# Patient Record
Sex: Male | Born: 1980 | Race: White | Hispanic: No | Marital: Single | Smoking: Current every day smoker
Health system: Southern US, Community
[De-identification: ages and names within clinical notes are randomized; demographics above are authoritative.]

## PROBLEM LIST (undated history)

## (undated) DIAGNOSIS — J449 Chronic obstructive pulmonary disease, unspecified: Secondary | ICD-10-CM

## (undated) DIAGNOSIS — F17200 Nicotine dependence, unspecified, uncomplicated: Secondary | ICD-10-CM

## (undated) HISTORY — DX: Nicotine dependence, unspecified, uncomplicated: F17.200

## (undated) HISTORY — PX: BRAIN SURGERY: SHX531

## (undated) HISTORY — PX: INGUINAL HERNIA REPAIR: SUR1180

## (undated) HISTORY — DX: Chronic obstructive pulmonary disease, unspecified: J44.9

---
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
## 2003-05-24 ENCOUNTER — Emergency Department (HOSPITAL_COMMUNITY): Admission: EM | Admit: 2003-05-24 | Discharge: 2003-05-24 | Payer: Self-pay | Admitting: Emergency Medicine

## 2004-12-02 ENCOUNTER — Emergency Department (HOSPITAL_COMMUNITY): Admission: EM | Admit: 2004-12-02 | Discharge: 2004-12-03 | Payer: Self-pay | Admitting: Emergency Medicine

## 2004-12-09 ENCOUNTER — Emergency Department (HOSPITAL_COMMUNITY): Admission: EM | Admit: 2004-12-09 | Discharge: 2004-12-10 | Payer: Self-pay | Admitting: Emergency Medicine

## 2004-12-21 ENCOUNTER — Emergency Department (HOSPITAL_COMMUNITY): Admission: EM | Admit: 2004-12-21 | Discharge: 2004-12-21 | Payer: Self-pay | Admitting: Emergency Medicine

## 2005-02-03 ENCOUNTER — Emergency Department (HOSPITAL_COMMUNITY): Admission: EM | Admit: 2005-02-03 | Discharge: 2005-02-03 | Payer: Self-pay | Admitting: Emergency Medicine

## 2005-11-10 ENCOUNTER — Emergency Department (HOSPITAL_COMMUNITY): Admission: EM | Admit: 2005-11-10 | Discharge: 2005-11-10 | Payer: Self-pay | Admitting: Emergency Medicine

## 2006-05-19 ENCOUNTER — Emergency Department (HOSPITAL_COMMUNITY): Admission: EM | Admit: 2006-05-19 | Discharge: 2006-05-19 | Payer: Self-pay | Admitting: Emergency Medicine

## 2008-01-13 ENCOUNTER — Encounter (INDEPENDENT_AMBULATORY_CARE_PROVIDER_SITE_OTHER): Payer: Self-pay | Admitting: Nurse Practitioner

## 2008-01-13 ENCOUNTER — Ambulatory Visit: Payer: Self-pay | Admitting: Family Medicine

## 2008-01-13 LAB — CONVERTED CEMR LAB
ALT: 16 units/L (ref 0–53)
AST: 17 units/L (ref 0–37)
Alkaline Phosphatase: 71 units/L (ref 39–117)
BUN: 16 mg/dL (ref 6–23)
Basophils Absolute: 0.1 10*3/uL (ref 0.0–0.1)
Basophils Relative: 1 % (ref 0–1)
Creatinine, Ser: 0.87 mg/dL (ref 0.40–1.50)
Eosinophils Absolute: 0.2 10*3/uL (ref 0.0–0.7)
HDL: 41 mg/dL (ref >39)
Hemoglobin: 16.5 g/dL (ref 13.0–17.0)
LDL Cholesterol: 102 mg/dL — ABNORMAL HIGH (ref 0–99)
MCHC: 33.5 g/dL (ref 30.0–36.0)
MCV: 90.8 fL (ref 78.0–100.0)
Monocytes Absolute: 0.7 10*3/uL (ref 0.1–1.0)
Monocytes Relative: 8 % (ref 3–12)
Neutro Abs: 5.9 10*3/uL (ref 1.7–7.7)
Neutrophils Relative %: 71 % (ref 43–77)
RBC: 5.43 M/uL (ref 4.22–5.81)
RDW: 14.2 % (ref 11.5–15.5)
TSH: 1.45 microintl units/mL (ref 0.350–5.50)
Total CHOL/HDL Ratio: 4
VLDL: 19 mg/dL (ref 0–40)

## 2009-04-26 ENCOUNTER — Encounter: Payer: Self-pay | Admitting: Emergency Medicine

## 2009-04-26 ENCOUNTER — Inpatient Hospital Stay (HOSPITAL_COMMUNITY): Admission: EM | Admit: 2009-04-26 | Discharge: 2009-04-29 | Payer: Self-pay | Admitting: *Deleted

## 2009-04-27 ENCOUNTER — Ambulatory Visit: Payer: Self-pay | Admitting: Physical Medicine & Rehabilitation

## 2009-09-19 ENCOUNTER — Emergency Department (HOSPITAL_COMMUNITY): Admission: EM | Admit: 2009-09-19 | Discharge: 2009-09-20 | Payer: Self-pay | Admitting: Emergency Medicine

## 2009-09-22 ENCOUNTER — Emergency Department (HOSPITAL_COMMUNITY): Admission: EM | Admit: 2009-09-22 | Discharge: 2009-09-22 | Payer: Self-pay | Admitting: Emergency Medicine

## 2010-03-08 ENCOUNTER — Emergency Department (HOSPITAL_COMMUNITY): Admission: EM | Admit: 2010-03-08 | Discharge: 2010-03-08 | Payer: Self-pay | Admitting: Emergency Medicine

## 2010-05-02 ENCOUNTER — Emergency Department (HOSPITAL_COMMUNITY): Admission: EM | Admit: 2010-05-02 | Discharge: 2010-05-02 | Payer: Self-pay | Admitting: Emergency Medicine

## 2011-01-15 ENCOUNTER — Emergency Department (HOSPITAL_COMMUNITY)
Admission: EM | Admit: 2011-01-15 | Discharge: 2011-01-15 | Disposition: A | Discharge status: Home / Self Care | Payer: Self-pay | Attending: Emergency Medicine | Admitting: Emergency Medicine

## 2011-01-15 DIAGNOSIS — R51 Headache: Secondary | ICD-10-CM | POA: Insufficient documentation

## 2011-01-15 DIAGNOSIS — H9190 Unspecified hearing loss, unspecified ear: Secondary | ICD-10-CM | POA: Insufficient documentation

## 2011-01-15 DIAGNOSIS — H539 Unspecified visual disturbance: Secondary | ICD-10-CM | POA: Insufficient documentation

## 2011-01-15 DIAGNOSIS — H6120 Impacted cerumen, unspecified ear: Secondary | ICD-10-CM | POA: Insufficient documentation

## 2011-01-15 DIAGNOSIS — R42 Dizziness and giddiness: Secondary | ICD-10-CM | POA: Insufficient documentation

## 2011-01-15 DIAGNOSIS — H53149 Visual discomfort, unspecified: Secondary | ICD-10-CM | POA: Insufficient documentation

## 2011-01-15 DIAGNOSIS — J3489 Other specified disorders of nose and nasal sinuses: Secondary | ICD-10-CM | POA: Insufficient documentation

## 2011-03-05 LAB — BASIC METABOLIC PANEL
BUN: 9 mg/dL (ref 6–23)
CO2: 23 mEq/L (ref 19–32)
Calcium: 8.7 mg/dL (ref 8.4–10.5)
Calcium: 8.1 mg/dL — ABNORMAL LOW (ref 8.4–10.5)
Chloride: 107 mEq/L (ref 96–112)
Chloride: 108 mEq/L (ref 96–112)
Creatinine, Ser: 0.89 mg/dL (ref 0.4–1.5)
Creatinine, Ser: 0.96 mg/dL (ref 0.4–1.5)
Creatinine, Ser: 0.89 mg/dL (ref 0.4–1.5)
GFR calc Af Amer: >60 mL/min (ref >60)
GFR calc Af Amer: >60 mL/min (ref >60)
GFR calc non Af Amer: >60 mL/min (ref >60)
GFR calc non Af Amer: >60 mL/min (ref >60)
Sodium: 139 mEq/L (ref 135–145)

## 2011-03-05 LAB — CBC
MCHC: 33.8 g/dL (ref 30.0–36.0)
MCV: 91 fL (ref 78.0–100.0)
MCV: 91.9 fL (ref 78.0–100.0)
Platelets: 129 10*3/uL — ABNORMAL LOW (ref 150–400)
Platelets: 131 10*3/uL — ABNORMAL LOW (ref 150–400)
RBC: 5.01 MIL/uL (ref 4.22–5.81)
RBC: 5.36 MIL/uL (ref 4.22–5.81)
RDW: 13.5 % (ref 11.5–15.5)
RDW: 13.9 % (ref 11.5–15.5)
WBC: 15.6 10*3/uL — ABNORMAL HIGH (ref 4.0–10.5)
WBC: 20.7 10*3/uL — ABNORMAL HIGH (ref 4.0–10.5)

## 2011-03-05 LAB — URINE CULTURE

## 2011-03-05 LAB — DIFFERENTIAL
Basophils Absolute: 0.1 10*3/uL (ref 0.0–0.1)
Basophils Relative: 1 % (ref 0–1)
Monocytes Relative: 6 % (ref 3–12)
Neutro Abs: 15.2 10*3/uL — ABNORMAL HIGH (ref 1.7–7.7)
Neutrophils Relative %: 74 % (ref 43–77)

## 2011-03-05 LAB — POCT I-STAT, CHEM 8
Calcium, Ion: 1.01 mmol/L — ABNORMAL LOW (ref 1.12–1.32)
Glucose, Bld: 110 mg/dL — ABNORMAL HIGH (ref 70–99)
HCT: 53 % — ABNORMAL HIGH (ref 39.0–52.0)
Hemoglobin: 18 g/dL — ABNORMAL HIGH (ref 13.0–17.0)
TCO2: 22 mmol/L (ref 0–100)

## 2011-03-05 LAB — URINALYSIS, ROUTINE W REFLEX MICROSCOPIC
Bilirubin Urine: NEGATIVE
Nitrite: NEGATIVE
Specific Gravity, Urine: 1.011 (ref 1.005–1.030)
Urobilinogen, UA: 0.2 mg/dL (ref 0.0–1.0)

## 2011-03-05 LAB — URINALYSIS, MICROSCOPIC ONLY
Glucose, UA: NEGATIVE mg/dL
Leukocytes, UA: NEGATIVE
pH: 7 (ref 5.0–8.0)

## 2011-03-05 LAB — RAPID URINE DRUG SCREEN, HOSP PERFORMED
Amphetamines: NOT DETECTED
Barbiturates: NOT DETECTED
Benzodiazepines: NOT DETECTED
Tetrahydrocannabinol: NOT DETECTED

## 2011-03-05 LAB — CULTURE, BLOOD (ROUTINE X 2): Culture: NO GROWTH
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
## 2015-09-15 ENCOUNTER — Encounter (HOSPITAL_COMMUNITY): Payer: Self-pay | Admitting: Neurology

## 2015-09-15 ENCOUNTER — Emergency Department (HOSPITAL_COMMUNITY): Payer: Self-pay

## 2015-09-15 ENCOUNTER — Emergency Department (HOSPITAL_COMMUNITY)
Admission: EM | Admit: 2015-09-15 | Discharge: 2015-09-15 | Discharge status: Against Medical Advice | Payer: Self-pay | Attending: Emergency Medicine | Admitting: Emergency Medicine

## 2015-09-15 DIAGNOSIS — R0602 Shortness of breath: Secondary | ICD-10-CM | POA: Insufficient documentation

## 2015-09-15 DIAGNOSIS — Z720 Tobacco use: Secondary | ICD-10-CM | POA: Insufficient documentation

## 2015-09-15 DIAGNOSIS — R079 Chest pain, unspecified: Secondary | ICD-10-CM | POA: Insufficient documentation

## 2015-09-15 LAB — BASIC METABOLIC PANEL
Anion gap: 10 (ref 5–15)
BUN: 8 mg/dL (ref 6–20)
CALCIUM: 8.4 mg/dL — AB (ref 8.9–10.3)
CO2: 22 mmol/L (ref 22–32)
CREATININE: 1.04 mg/dL (ref 0.61–1.24)
Chloride: 104 mmol/L (ref 101–111)
GFR calc non Af Amer: >60 mL/min (ref >60)
Glucose, Bld: 92 mg/dL (ref 65–99)
Potassium: 4.1 mmol/L (ref 3.5–5.1)
SODIUM: 136 mmol/L (ref 135–145)

## 2015-09-15 LAB — I-STAT TROPONIN, ED: Troponin i, poc: 0 ng/mL (ref 0.00–0.08)

## 2015-09-15 LAB — CBC
HCT: 48.7 % (ref 39.0–52.0)
Hemoglobin: 16.9 g/dL (ref 13.0–17.0)
MCH: 31.3 pg (ref 26.0–34.0)
MCHC: 34.7 g/dL (ref 30.0–36.0)
MCV: 90.2 fL (ref 78.0–100.0)
PLATELETS: 144 10*3/uL — AB (ref 150–400)
RBC: 5.4 MIL/uL (ref 4.22–5.81)
RDW: 13.4 % (ref 11.5–15.5)
WBC: 12 10*3/uL — AB (ref 4.0–10.5)

## 2015-09-15 NOTE — ED Notes (Addendum)
CALLED FOR ROOM- X3. NO ANSWER

## 2015-09-15 NOTE — ED Notes (Signed)
Pt reports cp and sob for 1 weelk, this morning got worse. Has central cp no radiation, deneis n/v/d. Denies cardiac hx is a x 4

## 2016-12-01 ENCOUNTER — Encounter (HOSPITAL_COMMUNITY): Payer: Self-pay

## 2016-12-01 ENCOUNTER — Emergency Department (HOSPITAL_COMMUNITY)
Admission: EM | Admit: 2016-12-01 | Discharge: 2016-12-01 | Disposition: A | Discharge status: Home / Self Care | Payer: Self-pay | Attending: Emergency Medicine | Admitting: Emergency Medicine

## 2016-12-01 DIAGNOSIS — Y9389 Activity, other specified: Secondary | ICD-10-CM | POA: Insufficient documentation

## 2016-12-01 DIAGNOSIS — T148XXA Other injury of unspecified body region, initial encounter: Secondary | ICD-10-CM

## 2016-12-01 DIAGNOSIS — X500XXA Overexertion from strenuous movement or load, initial encounter: Secondary | ICD-10-CM | POA: Insufficient documentation

## 2016-12-01 DIAGNOSIS — Y929 Unspecified place or not applicable: Secondary | ICD-10-CM | POA: Insufficient documentation

## 2016-12-01 DIAGNOSIS — S29012A Strain of muscle and tendon of back wall of thorax, initial encounter: Secondary | ICD-10-CM | POA: Insufficient documentation

## 2016-12-01 DIAGNOSIS — Y990 Civilian activity done for income or pay: Secondary | ICD-10-CM | POA: Insufficient documentation

## 2016-12-01 DIAGNOSIS — F17200 Nicotine dependence, unspecified, uncomplicated: Secondary | ICD-10-CM | POA: Insufficient documentation

## 2016-12-01 MED ORDER — IBUPROFEN 800 MG PO TABS: 800.0000 mg | ORAL_TABLET | Freq: Three times a day (TID) | ORAL | 0 refills | Status: DC

## 2016-12-01 MED ORDER — METHOCARBAMOL 500 MG PO TABS
500.0000 mg | ORAL_TABLET | Freq: Two times a day (BID) | ORAL | 0 refills | Status: DC
Start: 2016-12-01 — End: 2018-09-02

## 2016-12-01 NOTE — Discharge Instructions (Signed)
Expect your soreness to increase over the next 2-3 days. Take it easy, but do not lay around too much as this may make any stiffness worse. Take 500 mg of naproxen every 12 hours or 800 mg of ibuprofen every 8 hours for the next 3 days. Take these medications with food to avoid upset stomach. Robaxin is a muscle relaxer and may help loosen stiff muscles. Do not take the Robaxin while driving or performing other dangerous activities. Be sure to perform the attached exercises starting with three times a week and working up to performing them daily. This is an essential part of preventing long term problems. Follow up with a primary care provider for any future management of these complaints. You may also try lidocaine patches, such as Salonpas, applied directly to the painful area.

## 2016-12-01 NOTE — ED Notes (Signed)
ED Provider at bedside. 

## 2016-12-01 NOTE — ED Triage Notes (Signed)
Pt presents with upper back pain and L arm pain while picking up boxes at work last night.  Pt also reports 1 week h/o productive cough with green phlegm and nasal congestion.

## 2016-12-01 NOTE — ED Provider Notes (Signed)
[facility]-EMERGENCY DEPT Provider Note   CSN: [id number] Arrival date & time: 12/01/16  1333  By signing my name below, I, Sonum Patel, attest that this documentation has been prepared under the direction and in the presence of Jeany Seville, PA-C . Electronically Signed: Sonum Patel, Neurosurgeon. 12/01/16. 3:33 PM.  History   Chief Complaint Chief Complaint  Patient presents with  . Back Pain    The history is provided by the patient. No language interpreter was used.     HPI Comments: Douglas Guzman is a 36 y.o. male who presents to the Emergency Department complaining of constant left sided upper to mid back pain that began last night. He reports lifting unusually heavy boxes at work last night. He states the pain is worse with movement of the left arm and upper body. He denies numbness, weakness, chest pain, shortness of breath, or any other complaints.   History reviewed. No pertinent past medical history.  There are no active problems to display for this patient.   Past Surgical History:  Procedure Laterality Date  . BRAIN SURGERY         Home Medications    Prior to Admission medications   Medication Sig Start Date End Date Taking? Authorizing Provider  ibuprofen (ADVIL,MOTRIN) 800 MG tablet Take 1 tablet (800 mg total) by mouth 3 (three) times daily. 12/01/16   Obediah Welles C Charliene Inoue, PA-C  methocarbamol (ROBAXIN) 500 MG tablet Take 1 tablet (500 mg total) by mouth 2 (two) times daily. 12/01/16   Anselm Pancoast, PA-C    Family History History reviewed. No pertinent family history.  Social History Social History  Substance Use Topics  . Smoking status: Current Every Day Smoker  . Smokeless tobacco: Never Used  . Alcohol use Yes     Allergies   Patient has no known allergies.   Review of Systems Review of Systems  Musculoskeletal: Positive for back pain and myalgias.  Neurological: Negative for weakness and numbness.     Physical Exam Updated Vital Signs BP 112/68 (BP  Location: Right Arm)   Pulse 74   Temp 98.3 F (36.8 C) (Oral)   Resp 18   Ht 5\' 8"  (1.727 m)   Wt 68 kg   SpO2 98%   BMI 22.81 kg/m   Physical Exam  Constitutional: He appears well-developed and well-nourished. No distress.  HENT:  Head: Normocephalic and atraumatic.  Eyes: Conjunctivae are normal.  Neck: Neck supple.  Cardiovascular: Normal rate, regular rhythm and intact distal pulses.   Pulmonary/Chest: Effort normal and breath sounds normal. No respiratory distress.  Musculoskeletal: He exhibits tenderness.  Tenderness over the left trapezius, left scapula, and left latissimus dorsi. Full range of motion in the left shoulder and neck. Normal motor function intact in all other extremities and spine. No midline spinal tenderness.   Neurological: He is alert.  No sensory deficits. Strength 5 out of 5 in bilateral upper extremities.  Skin: Skin is warm and dry. Capillary refill takes less than 2 seconds. He is not diaphoretic.  Psychiatric: He has a normal mood and affect. His behavior is normal.  Nursing note and vitals reviewed.    ED Treatments / Results  DIAGNOSTIC STUDIES: Oxygen Saturation is 98% on RA, normal by my interpretation.    COORDINATION OF CARE: 3:33 PM Discussed treatment plan with pt at bedside and pt agreed to plan.   Labs (all labs ordered are listed, but only abnormal results are displayed) Labs Reviewed - No data to  display  EKG  EKG Interpretation None       Radiology No results found.  Procedures Procedures (including critical care time)  Medications Ordered in ED Medications - No data to display   Initial Impression / Assessment and Plan / ED Course  I have reviewed the triage vital signs and the nursing notes.  Pertinent labs & imaging results that were available during my care of the patient were reviewed by me and considered in my medical decision making (see chart for details).  Clinical Course     Patient presents with  muscular tenderness and a story consistent with muscle strain. No neuro or functional deficits. The patient was given instructions for home care as well as return precautions. Patient voices understanding of these instructions, accepts the plan, and is comfortable with discharge.    Final Clinical Impressions(s) / ED Diagnoses   Final diagnoses:  Muscle strain    New Prescriptions Discharge Medication List as of 12/01/2016  3:36 PM    START taking these medications   Details  ibuprofen (ADVIL,MOTRIN) 800 MG tablet Take 1 tablet (800 mg total) by mouth 3 (three) times daily., Starting Sat 12/01/2016, Print    methocarbamol (ROBAXIN) 500 MG tablet Take 1 tablet (500 mg total) by mouth 2 (two) times daily., Starting Sat 12/01/2016, Print       I personally performed the services described in this documentation, which was scribed in my presence. The recorded information has been reviewed and is accurate.   Anselm PancoastShawn C Azaryah Oleksy, PA-C 12/01/16 1809    Nira ConnPedro Eduardo Cardama, MD 12/02/16 [phone]

## 2017-05-19 ENCOUNTER — Emergency Department (HOSPITAL_COMMUNITY): Payer: Self-pay

## 2017-05-19 ENCOUNTER — Emergency Department (HOSPITAL_COMMUNITY)
Admission: EM | Admit: 2017-05-19 | Discharge: 2017-05-19 | Disposition: A | Discharge status: Home / Self Care | Payer: Self-pay | Attending: Emergency Medicine | Admitting: Emergency Medicine

## 2017-05-19 DIAGNOSIS — W25XXXA Contact with sharp glass, initial encounter: Secondary | ICD-10-CM | POA: Insufficient documentation

## 2017-05-19 DIAGNOSIS — S61011A Laceration without foreign body of right thumb without damage to nail, initial encounter: Secondary | ICD-10-CM | POA: Insufficient documentation

## 2017-05-19 DIAGNOSIS — Y9389 Activity, other specified: Secondary | ICD-10-CM | POA: Insufficient documentation

## 2017-05-19 DIAGNOSIS — S51811A Laceration without foreign body of right forearm, initial encounter: Secondary | ICD-10-CM | POA: Insufficient documentation

## 2017-05-19 DIAGNOSIS — Z23 Encounter for immunization: Secondary | ICD-10-CM | POA: Insufficient documentation

## 2017-05-19 DIAGNOSIS — Y92009 Unspecified place in unspecified non-institutional (private) residence as the place of occurrence of the external cause: Secondary | ICD-10-CM | POA: Insufficient documentation

## 2017-05-19 DIAGNOSIS — S41111A Laceration without foreign body of right upper arm, initial encounter: Secondary | ICD-10-CM

## 2017-05-19 DIAGNOSIS — Y998 Other external cause status: Secondary | ICD-10-CM | POA: Insufficient documentation

## 2017-05-19 DIAGNOSIS — F17200 Nicotine dependence, unspecified, uncomplicated: Secondary | ICD-10-CM | POA: Insufficient documentation

## 2017-05-19 MED ORDER — LIDOCAINE-EPINEPHRINE (PF) 2 %-1:200000 IJ SOLN
10.0000 mL | Freq: Once | INTRAMUSCULAR | Status: AC
  Administered 2017-05-19: 10 mL
  Filled 2017-05-19: qty 20

## 2017-05-19 MED ORDER — BACITRACIN ZINC 500 UNIT/GM EX OINT: 1.0000 "application " | TOPICAL_OINTMENT | Freq: Three times a day (TID) | CUTANEOUS | 1 refills | Status: DC

## 2017-05-19 MED ORDER — LIDOCAINE HCL (PF) 1 % IJ SOLN
5.0000 mL | Freq: Once | INTRAMUSCULAR | Status: AC
  Administered 2017-05-19: 5 mL
  Filled 2017-05-19: qty 5

## 2017-05-19 MED ORDER — TETANUS-DIPHTH-ACELL PERTUSSIS 5-2.5-18.5 LF-MCG/0.5 IM SUSP
0.5000 mL | Freq: Once | INTRAMUSCULAR | Status: AC
  Administered 2017-05-19: 0.5 mL via INTRAMUSCULAR
  Filled 2017-05-19: qty 0.5

## 2017-05-19 NOTE — Discharge Instructions (Signed)
Please follow up in 7-10 days for suture removal.
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
## 2017-05-19 NOTE — ED Notes (Signed)
Patient transported to X-ray 

## 2017-05-29 ENCOUNTER — Emergency Department (HOSPITAL_COMMUNITY)
Admission: EM | Admit: 2017-05-29 | Discharge: 2017-05-29 | Disposition: A | Discharge status: Home / Self Care | Payer: Self-pay | Attending: Emergency Medicine | Admitting: Emergency Medicine

## 2017-05-29 ENCOUNTER — Encounter (HOSPITAL_COMMUNITY): Payer: Self-pay | Admitting: Emergency Medicine

## 2017-05-29 DIAGNOSIS — Y999 Unspecified external cause status: Secondary | ICD-10-CM | POA: Insufficient documentation

## 2017-05-29 DIAGNOSIS — S4981XD Other specified injuries of right shoulder and upper arm, subsequent encounter: Secondary | ICD-10-CM | POA: Insufficient documentation

## 2017-05-29 DIAGNOSIS — W25XXXD Contact with sharp glass, subsequent encounter: Secondary | ICD-10-CM | POA: Insufficient documentation

## 2017-05-29 DIAGNOSIS — Y9389 Activity, other specified: Secondary | ICD-10-CM | POA: Insufficient documentation

## 2017-05-29 DIAGNOSIS — Z4802 Encounter for removal of sutures: Secondary | ICD-10-CM

## 2017-05-29 DIAGNOSIS — F17210 Nicotine dependence, cigarettes, uncomplicated: Secondary | ICD-10-CM | POA: Insufficient documentation

## 2017-05-29 DIAGNOSIS — Y929 Unspecified place or not applicable: Secondary | ICD-10-CM | POA: Insufficient documentation

## 2017-05-29 DIAGNOSIS — Z79899 Other long term (current) drug therapy: Secondary | ICD-10-CM | POA: Insufficient documentation

## 2017-05-29 NOTE — ED Triage Notes (Signed)
Pt here for suture removal to right arm and hand
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
## 2017-05-29 NOTE — Discharge Instructions (Signed)
Apply bacitracin to the wounds. Keep clean, wash with soap and water. Follow up with family doctor as needed.

## 2017-10-23 ENCOUNTER — Ambulatory Visit: Payer: Self-pay | Admitting: Family Medicine

## 2018-01-18 ENCOUNTER — Emergency Department (HOSPITAL_COMMUNITY): Payer: Self-pay

## 2018-01-18 ENCOUNTER — Emergency Department (HOSPITAL_COMMUNITY)
Admission: EM | Admit: 2018-01-18 | Discharge: 2018-01-18 | Disposition: A | Discharge status: Home / Self Care | Payer: Self-pay | Attending: Emergency Medicine | Admitting: Emergency Medicine

## 2018-01-18 ENCOUNTER — Other Ambulatory Visit: Payer: Self-pay

## 2018-01-18 ENCOUNTER — Encounter (HOSPITAL_COMMUNITY): Payer: Self-pay | Admitting: Emergency Medicine

## 2018-01-18 DIAGNOSIS — R51 Headache: Secondary | ICD-10-CM | POA: Insufficient documentation

## 2018-01-18 DIAGNOSIS — F17200 Nicotine dependence, unspecified, uncomplicated: Secondary | ICD-10-CM | POA: Insufficient documentation

## 2018-01-18 DIAGNOSIS — R519 Headache, unspecified: Secondary | ICD-10-CM

## 2018-01-18 MED ORDER — TETRACAINE HCL 0.5 % OP SOLN
1.0000 [drp] | Freq: Once | OPHTHALMIC | Status: DC
  Filled 2018-01-18: qty 4

## 2018-01-18 MED ORDER — PROCHLORPERAZINE EDISYLATE 5 MG/ML IJ SOLN
10.0000 mg | Freq: Once | INTRAMUSCULAR | Status: AC
  Administered 2018-01-18: 10 mg via INTRAVENOUS
  Filled 2018-01-18: qty 2

## 2018-01-18 MED ORDER — KETOROLAC TROMETHAMINE 30 MG/ML IJ SOLN
30.0000 mg | Freq: Once | INTRAMUSCULAR | Status: AC
  Administered 2018-01-18: 30 mg via INTRAVENOUS
  Filled 2018-01-18: qty 1

## 2018-01-18 NOTE — ED Notes (Signed)
Pt left AMA without notifying staff of intent to leave. Pt called at home and cell to return to ED to have IV removed by staff, no answer from pt.

## 2018-01-18 NOTE — ED Notes (Signed)
ED Provider at bedside. 

## 2018-01-18 NOTE — ED Triage Notes (Signed)
Pt also c/o blurred vision

## 2018-01-18 NOTE — ED Provider Notes (Signed)
[name] Provider Note   CSN: [id number] Arrival date & time: 01/18/18  1358     History   Chief Complaint Chief Complaint  Patient presents with  . Headache    HPI Douglas Guzman is a 37 y.o. male.  HPI  The patient is a 37 year old male, he states he is otherwise healthy, takes no daily medications, states that he has had intermittent headaches over time though they are usually mild and self-limited.  Over the last week he has had more frequent headaches, they seem to be around the right side of his eye, the right side of the face and the right temporal area.  There are throbbing, sometimes they are severe.  The patient denies of vomiting or nausea but has had some photophobia.  He denies any weakness of his arms or his legs, denies any changes in speech but has had some intermittent blurred vision in the right eye only.  The patient has no pain with extraocular movements.  He denies any fevers chills coughing or shortness of breath but has had a mild runny nose.  He denies taking any medications other than some over-the-counter medications with have not helped.  History reviewed. No pertinent past medical history.  There are no active problems to display for this patient.   Past Surgical History:  Procedure Laterality Date  . BRAIN SURGERY         Home Medications    Prior to Admission medications   Medication Sig Start Date End Date Taking? Authorizing Provider  bacitracin ointment Apply 1 application topically 3 (three) times daily. 05/19/17   Everlene Farrieransie, William, PA-C  ibuprofen (ADVIL,MOTRIN) 800 MG tablet Take 1 tablet (800 mg total) by mouth 3 (three) times daily. 12/01/16   Joy, Shawn C, PA-C  methocarbamol (ROBAXIN) 500 MG tablet Take 1 tablet (500 mg total) by mouth 2 (two) times daily. 12/01/16   Anselm PancoastJoy, Shawn C, PA-C    Family History History reviewed. No pertinent family history.  Social History Social History   Tobacco  Use  . Smoking status: Current Every Day Smoker    Packs/day: 2.00  . Smokeless tobacco: Never Used  Substance Use Topics  . Alcohol use: Yes  . Drug use: Not on file     Allergies   Patient has no known allergies.   Review of Systems Review of Systems  All other systems reviewed and are negative.    Physical Exam Updated Vital Signs BP (!) 150/74 (BP Location: Right Arm)   Pulse 61   Temp 98.1 F (36.7 C) (Oral)   Resp 20   Ht 5\' 8"  (1.727 m)   Wt 65.8 kg (145 lb)   SpO2 100%   BMI 22.05 kg/m   Physical Exam  Constitutional: He appears well-developed and well-nourished. No distress.  HENT:  Head: Normocephalic and atraumatic.  Mouth/Throat: Oropharynx is clear and moist. No oropharyngeal exudate.  Normal-appearing face, symmetrical, no swelling, tenderness to palpation in the right periorbital area, the right cheek, the right mandibular area and the right temple.  If this is not focal over the temporal artery.  Eyes: Conjunctivae and EOM are normal. Pupils are equal, round, and reactive to light. Right eye exhibits no discharge. Left eye exhibits no discharge. No scleral icterus.  Disconjugate gaze is present, patient states this is baseline and has had surgery in the past, pupils are equal round and reactive to light  Neck: Normal range of motion. Neck supple. No  JVD present. No thyromegaly present.  Cardiovascular: Normal rate, regular rhythm, normal heart sounds and intact distal pulses. Exam reveals no gallop and no friction rub.  No murmur heard. Pulmonary/Chest: Effort normal and breath sounds normal. No respiratory distress. He has no wheezes. He has no rales.  Abdominal: Soft. Bowel sounds are normal. He exhibits no distension and no mass. There is no tenderness.  Musculoskeletal: Normal range of motion. He exhibits no edema or tenderness.  Lymphadenopathy:    He has no cervical adenopathy.  Neurological: He is alert. Coordination normal.  Speech is clear,  cranial nerves III through XII are intact, memory is intact, strength is normal in all 4 extremities including grips, sensation is intact to light touch and pinprick in all 4 extremities. Coordination as tested by finger-nose-finger is normal, no limb ataxia. Normal gait, normal reflexes at the patellar tendons bilaterally  Skin: Skin is warm and dry. No rash noted. No erythema.  Psychiatric: He has a normal mood and affect. His behavior is normal.  Nursing note and vitals reviewed.    ED Treatments / Results  Labs (all labs ordered are listed, but only abnormal results are displayed) Labs Reviewed - No data to display   Radiology No results found.  Procedures Procedures (including critical care time)  Medications Ordered in ED Medications  tetracaine (PONTOCAINE) 0.5 % ophthalmic solution 1 drop (1 drop Both Eyes Not Given 01/18/18 1845)  ketorolac (TORADOL) 30 MG/ML injection 30 mg (30 mg Intravenous Given 01/18/18 1645)  prochlorperazine (COMPAZINE) injection 10 mg (10 mg Intravenous Given 01/18/18 1645)     Initial Impression / Assessment and Plan / ED Course  I have reviewed the triage vital signs and the nursing notes.  Pertinent labs & imaging results that were available during my care of the patient were reviewed by me and considered in my medical decision making (see chart for details).     Of particular concern would be several different etiologies including optic neuritis, acute glaucoma, less likely to be intracranial.  We will initially obtain CT scan due to severity of pain, doubt subarachnoid hemorrhage given the waxing and waning of symptoms and normal neurologic exam.  The patient left the emergency department against advice, he was not present in the room when CT scan came to get him for his head CT.  He walked out prior to having his eyes evaluated with Tono-Pen.  There was no discussion with the patient as he left prior to any AGAINST MEDICAL ADVICE  discussion.  Final Clinical Impressions(s) / ED Diagnoses   Final diagnoses:  Nonintractable headache, unspecified chronicity pattern, unspecified headache type      Eber Hong, MD 01/18/18 1859

## 2018-01-18 NOTE — ED Triage Notes (Signed)
Patient presents to ED for assessment of right sided facial/head pain starting this morning.  PAtient denies dental pain, denies injury.  States he has been having some neck pain for a while now.  No neuro deficits noted at triage.  Denies light/sound sensitivity.  Denies n/v.

## 2018-06-05 ENCOUNTER — Other Ambulatory Visit: Payer: Self-pay

## 2018-06-05 ENCOUNTER — Encounter (HOSPITAL_COMMUNITY): Payer: Self-pay

## 2018-06-05 ENCOUNTER — Emergency Department (HOSPITAL_COMMUNITY)
Admission: EM | Admit: 2018-06-05 | Discharge: 2018-06-05 | Disposition: A | Discharge status: Home / Self Care | Payer: Self-pay | Attending: Emergency Medicine | Admitting: Emergency Medicine

## 2018-06-05 ENCOUNTER — Emergency Department (HOSPITAL_COMMUNITY): Payer: Self-pay

## 2018-06-05 DIAGNOSIS — R0789 Other chest pain: Secondary | ICD-10-CM | POA: Insufficient documentation

## 2018-06-05 DIAGNOSIS — F17200 Nicotine dependence, unspecified, uncomplicated: Secondary | ICD-10-CM | POA: Insufficient documentation

## 2018-06-05 LAB — HEPATIC FUNCTION PANEL
ALT: 20 U/L (ref 0–44)
AST: 29 U/L (ref 15–41)
Albumin: 4.1 g/dL (ref 3.5–5.0)
Alkaline Phosphatase: 75 U/L (ref 38–126)
BILIRUBIN DIRECT: 0.2 mg/dL (ref 0.0–0.2)
BILIRUBIN INDIRECT: 0.6 mg/dL (ref 0.3–0.9)
TOTAL PROTEIN: 6.6 g/dL (ref 6.5–8.1)
Total Bilirubin: 0.8 mg/dL (ref 0.3–1.2)

## 2018-06-05 LAB — CBC
HEMATOCRIT: 49.5 % (ref 39.0–52.0)
HEMOGLOBIN: 16.5 g/dL (ref 13.0–17.0)
MCH: 31.4 pg (ref 26.0–34.0)
MCHC: 33.3 g/dL (ref 30.0–36.0)
MCV: 94.1 fL (ref 78.0–100.0)
Platelets: 157 10*3/uL (ref 150–400)
RBC: 5.26 MIL/uL (ref 4.22–5.81)
RDW: 14.1 % (ref 11.5–15.5)
WBC: 10.1 10*3/uL (ref 4.0–10.5)

## 2018-06-05 LAB — BASIC METABOLIC PANEL
ANION GAP: 9 (ref 5–15)
BUN: 14 mg/dL (ref 6–20)
CHLORIDE: 103 mmol/L (ref 98–111)
CO2: 27 mmol/L (ref 22–32)
Calcium: 8.9 mg/dL (ref 8.9–10.3)
Creatinine, Ser: 1.12 mg/dL (ref 0.61–1.24)
GFR calc Af Amer: >60 mL/min (ref >60)
GFR calc non Af Amer: >60 mL/min (ref >60)
GLUCOSE: 70 mg/dL (ref 70–99)
POTASSIUM: 4.3 mmol/L (ref 3.5–5.1)
Sodium: 139 mmol/L (ref 135–145)

## 2018-06-05 LAB — URINALYSIS, ROUTINE W REFLEX MICROSCOPIC
Bilirubin Urine: NEGATIVE
Glucose, UA: NEGATIVE mg/dL
Hgb urine dipstick: NEGATIVE
KETONES UR: NEGATIVE mg/dL
Leukocytes, UA: NEGATIVE
NITRITE: NEGATIVE
Protein, ur: NEGATIVE mg/dL
Specific Gravity, Urine: 1.025 (ref 1.005–1.030)
pH: 5 (ref 5.0–8.0)

## 2018-06-05 LAB — I-STAT TROPONIN, ED: Troponin i, poc: 0 ng/mL (ref 0.00–0.08)

## 2018-06-05 LAB — LIPASE, BLOOD: LIPASE: 43 U/L (ref 11–51)

## 2018-06-05 MED ORDER — FAMOTIDINE 20 MG PO TABS: 20.0000 mg | ORAL_TABLET | Freq: Two times a day (BID) | ORAL | 0 refills | Status: DC

## 2018-06-05 MED ORDER — ONDANSETRON HCL 4 MG/2ML IJ SOLN
4.0000 mg | Freq: Once | INTRAMUSCULAR | Status: AC
Start: 2018-06-05 — End: 2018-06-05
  Administered 2018-06-05: 4 mg via INTRAVENOUS
  Filled 2018-06-05: qty 2

## 2018-06-05 MED ORDER — SODIUM CHLORIDE 0.9 % IV BOLUS
500.0000 mL | Freq: Once | INTRAVENOUS | Status: AC
  Administered 2018-06-05: 500 mL via INTRAVENOUS

## 2018-06-05 MED ORDER — KETOROLAC TROMETHAMINE 30 MG/ML IJ SOLN
30.0000 mg | Freq: Once | INTRAMUSCULAR | Status: AC
  Administered 2018-06-05: 30 mg via INTRAVENOUS
  Filled 2018-06-05: qty 1

## 2018-06-05 MED ORDER — NAPROXEN 500 MG PO TABS: 500.0000 mg | ORAL_TABLET | Freq: Two times a day (BID) | ORAL | 0 refills | Status: DC

## 2018-06-05 NOTE — ED Notes (Signed)
Patient Alert and oriented to baseline. Stable and ambulatory to baseline. Patient verbalized understanding of the discharge instructions.  Patient belongings were taken by the patient.   

## 2018-06-05 NOTE — ED Notes (Signed)
Pt not in xray.

## 2018-06-05 NOTE — ED Triage Notes (Signed)
Pt presents for evaluation of central chest pain with radiation to back. Denies cardiac hx.

## 2018-06-05 NOTE — ED Provider Notes (Signed)
[name] EMERGENCY DEPARTMENT Provider Note   CSN: [id number] Arrival date & time: 06/05/18  1338     History   Chief Complaint Chief Complaint  Patient presents with  . Chest Pain    HPI Douglas Guzman is a 37 y.o. male who presents to ED for evaluation of 4-hour history of lower chest/epigastric/periumbilical pain that began after waking up this morning.  The pain radiates to his back.  He reports associated shortness of breath, cough productive with mucus, dysuria, nausea.  He has not taken any medicine to help with his symptoms.  He reports having chronic shortness of breath.  No sick contacts with similar symptoms.  Denies any fever, vomiting, diarrhea, constipation, hemoptysis, leg swelling, recent surgeries, recent prolonged travel, history of DVT, PE or MI.  He reports daily tobacco and occasional alcohol use.  Denies any other drug use.  HPI  History reviewed. No pertinent past medical history.  There are no active problems to display for this patient.   Past Surgical History:  Procedure Laterality Date  . BRAIN SURGERY          Home Medications    Prior to Admission medications   Medication Sig Start Date End Date Taking? Authorizing Provider  bacitracin ointment Apply 1 application topically 3 (three) times daily. 05/19/17   Everlene Farrier, PA-C  famotidine (PEPCID) 20 MG tablet Take 1 tablet (20 mg total) by mouth 2 (two) times daily. 06/05/18   Loria Lacina, PA-C  ibuprofen (ADVIL,MOTRIN) 800 MG tablet Take 1 tablet (800 mg total) by mouth 3 (three) times daily. 12/01/16   Joy, Shawn C, PA-C  methocarbamol (ROBAXIN) 500 MG tablet Take 1 tablet (500 mg total) by mouth 2 (two) times daily. 12/01/16   Joy, Shawn C, PA-C  naproxen (NAPROSYN) 500 MG tablet Take 1 tablet (500 mg total) by mouth 2 (two) times daily. 06/05/18   Dietrich Pates, PA-C    Family History No family history on file.  Social History Social History   Tobacco Use  .  Smoking status: Current Every Day Smoker    Packs/day: 2.00  . Smokeless tobacco: Never Used  Substance Use Topics  . Alcohol use: Yes  . Drug use: Not on file     Allergies   Patient has no known allergies.   Review of Systems Review of Systems  Constitutional: Negative for appetite change, chills and fever.  HENT: Negative for congestion, ear pain, rhinorrhea, sneezing and sore throat.   Eyes: Negative for photophobia and visual disturbance.  Respiratory: Positive for cough and shortness of breath. Negative for chest tightness and wheezing.   Cardiovascular: Positive for chest pain. Negative for palpitations.  Gastrointestinal: Positive for abdominal pain and nausea. Negative for blood in stool, constipation, diarrhea and vomiting.  Genitourinary: Positive for dysuria. Negative for hematuria and urgency.  Musculoskeletal: Negative for myalgias.  Skin: Negative for rash.  Neurological: Negative for dizziness, weakness and light-headedness.     Physical Exam Updated Vital Signs BP 120/81 (BP Location: Right Arm)   Pulse (!) 50   Temp 97.8 F (36.6 C) (Oral)   Resp 16   SpO2 100%   Physical Exam  Constitutional: He appears well-developed and well-nourished. No distress.  HENT:  Head: Normocephalic and atraumatic.  Nose: Nose normal.  Eyes: Conjunctivae and EOM are normal. Right eye exhibits no discharge. Left eye exhibits no discharge. No scleral icterus.  Neck: Normal range of motion. Neck supple.  Cardiovascular: Normal rate, regular rhythm,  normal heart sounds and intact distal pulses. Exam reveals no gallop and no friction rub.  No murmur heard. Pulmonary/Chest: Effort normal and breath sounds normal. No respiratory distress.  No chest tenderness to palpation.  Abdominal: Soft. Bowel sounds are normal. He exhibits no distension. There is no tenderness. There is no guarding.    Musculoskeletal: Normal range of motion. He exhibits no edema.  No lower extremity  edema, erythema or calf tenderness bilaterally.  Neurological: He is alert. He exhibits normal muscle tone. Coordination normal.  Skin: Skin is warm and dry. No rash noted.  Psychiatric: He has a normal mood and affect.  Nursing note and vitals reviewed.    ED Treatments / Results  Labs (all labs ordered are listed, but only abnormal results are displayed) Labs Reviewed  BASIC METABOLIC PANEL  CBC  HEPATIC FUNCTION PANEL  LIPASE, BLOOD  URINALYSIS, ROUTINE W REFLEX MICROSCOPIC  I-STAT TROPONIN, ED    EKG EKG Interpretation  Date/Time:  Thursday June 05 2018 15:26:15 EDT Ventricular Rate:  50 PR Interval:  168 QRS Duration: 100 QT Interval:  474 QTC Calculation: 432 R Axis:   119 Text Interpretation:  Sinus bradycardia Biatrial enlargement Right axis deviation Incomplete right bundle branch block Anterior infarct , age undetermined Abnormal ECG Since last tracing rate slower Confirmed by Jacalyn LefevreHaviland, Julie [phone]([id number]) on 06/05/2018 3:47:56 PM   Radiology Dg Chest 2 View  Result Date: 06/05/2018 CLINICAL DATA:  37 year old male with a history of chest pain EXAM: CHEST - 2 VIEW COMPARISON:  09/15/2015 FINDINGS: Cardiomediastinal silhouette within normal limits. No evidence of central vascular congestion. No pneumothorax or pleural effusion. No confluent airspace disease. No displaced fracture. IMPRESSION: Negative for acute cardiopulmonary disease Electronically Signed   By: Gilmer MorJaime  Wagner D.O.   On: 06/05/2018 14:29    Procedures Procedures (including critical care time)  Medications Ordered in ED Medications  sodium chloride 0.9 % bolus 500 mL (500 mLs Intravenous New Bag/Given 06/05/18 1734)  ondansetron (ZOFRAN) injection 4 mg (4 mg Intravenous Given 06/05/18 1624)  ketorolac (TORADOL) 30 MG/ML injection 30 mg (30 mg Intravenous Given 06/05/18 1734)     Initial Impression / Assessment and Plan / ED Course  I have reviewed the triage vital signs and the nursing  notes.  Pertinent labs & imaging results that were available during my care of the patient were reviewed by me and considered in my medical decision making (see chart for details).     37 year old male presents to ED for evaluation of 4-hour history of lower chest/epigastric/periumbilical pain that began after waking up this morning.  Pain radiates to his back.  Reports associated shortness of breath, cough productive with mucus, dysuria and nausea.  Has not taken any medicine help with pain.  Denies any fever, vomiting, bowel changes, hemoptysis, leg swelling, recent surgeries, recent prolonged travel, history of DVT, PE or MI.  On physical exam he is overall well-appearing.  He no lower extremity edema, erythema or calf tenderness bilaterally.  Lungs are clear to auscultation bilaterally.  He is not tachycardic, tachypneic or hypoxic.  There is generalized abdominal tenderness to palpation with no specific right lower quadrant tenderness, rebound or guarding noted.  He appears overall well.  He is afebrile.  Patient's labs here including CBC, CMP, lipase, troponin, urinalysis all unremarkable.  EKG is nonischemic.  Chest x-ray is unremarkable.  Patient was given fluids and anti-inflammatories with complete resolution of his symptoms.  He is low risk by heart score and PERC negative.  I doubt cardiac or pulmonary cause of symptoms.  Suspect that symptoms are musculoskeletal in nature.  Will discharge home with anti-inflammatories, antacids and PCP follow-up.  Advised to return to ED for any severe worsening symptoms.  Portions of this note were generated with Scientist, clinical (histocompatibility and immunogenetics). Dictation errors may occur despite best attempts at proofreading.   Final Clinical Impressions(s) / ED Diagnoses   Final diagnoses:  Chest wall pain    ED Discharge Orders        Ordered    naproxen (NAPROSYN) 500 MG tablet  2 times daily     06/05/18 1752    famotidine (PEPCID) 20 MG tablet  2 times daily      06/05/18 1752       Dietrich Pates, PA-C 06/05/18 1755    Jacalyn Lefevre, MD 06/05/18 1950

## 2018-06-05 NOTE — Discharge Instructions (Signed)
Return to ED for any worsening symptoms including trouble breathing or trouble swallowing, severe chest pain, lightheadedness or loss of consciousness.

## 2018-06-05 NOTE — ED Notes (Signed)
Unable to locate pt  

## 2018-08-06 ENCOUNTER — Ambulatory Visit: Payer: Self-pay | Attending: Family Medicine | Admitting: Family Medicine

## 2018-08-06 ENCOUNTER — Encounter: Payer: Self-pay | Admitting: Family Medicine

## 2018-08-06 ENCOUNTER — Ambulatory Visit (HOSPITAL_COMMUNITY)
Admission: RE | Admit: 2018-08-06 | Discharge: 2018-08-06 | Disposition: A | Discharge status: Home / Self Care | Payer: Medicaid Other | Source: Ambulatory Visit | Attending: Family Medicine | Admitting: Family Medicine

## 2018-08-06 ENCOUNTER — Telehealth: Payer: Self-pay

## 2018-08-06 ENCOUNTER — Other Ambulatory Visit: Payer: Self-pay

## 2018-08-06 VITALS — BP 129/80 | HR 67 | Temp 98.4°F | Resp 20 | Ht 69.0 in | Wt 140.8 lb

## 2018-08-06 DIAGNOSIS — R058 Other specified cough: Secondary | ICD-10-CM

## 2018-08-06 DIAGNOSIS — R05 Cough: Secondary | ICD-10-CM

## 2018-08-06 DIAGNOSIS — G8929 Other chronic pain: Secondary | ICD-10-CM | POA: Insufficient documentation

## 2018-08-06 DIAGNOSIS — Z833 Family history of diabetes mellitus: Secondary | ICD-10-CM | POA: Insufficient documentation

## 2018-08-06 DIAGNOSIS — Z8249 Family history of ischemic heart disease and other diseases of the circulatory system: Secondary | ICD-10-CM | POA: Insufficient documentation

## 2018-08-06 DIAGNOSIS — K1370 Unspecified lesions of oral mucosa: Secondary | ICD-10-CM

## 2018-08-06 DIAGNOSIS — F17200 Nicotine dependence, unspecified, uncomplicated: Secondary | ICD-10-CM

## 2018-08-06 DIAGNOSIS — Z808 Family history of malignant neoplasm of other organs or systems: Secondary | ICD-10-CM | POA: Insufficient documentation

## 2018-08-06 DIAGNOSIS — M545 Low back pain, unspecified: Secondary | ICD-10-CM

## 2018-08-06 DIAGNOSIS — Z23 Encounter for immunization: Secondary | ICD-10-CM

## 2018-08-06 DIAGNOSIS — K219 Gastro-esophageal reflux disease without esophagitis: Secondary | ICD-10-CM

## 2018-08-06 DIAGNOSIS — R350 Frequency of micturition: Secondary | ICD-10-CM

## 2018-08-06 DIAGNOSIS — F17210 Nicotine dependence, cigarettes, uncomplicated: Secondary | ICD-10-CM | POA: Insufficient documentation

## 2018-08-06 DIAGNOSIS — Z825 Family history of asthma and other chronic lower respiratory diseases: Secondary | ICD-10-CM | POA: Insufficient documentation

## 2018-08-06 DIAGNOSIS — R0789 Other chest pain: Secondary | ICD-10-CM | POA: Insufficient documentation

## 2018-08-06 DIAGNOSIS — R0602 Shortness of breath: Secondary | ICD-10-CM

## 2018-08-06 DIAGNOSIS — R55 Syncope and collapse: Secondary | ICD-10-CM

## 2018-08-06 LAB — POCT URINALYSIS DIP (CLINITEK)
Bilirubin, UA: NEGATIVE
Blood, UA: NEGATIVE
Glucose, UA: NEGATIVE mg/dL
Ketones, POC UA: NEGATIVE mg/dL
Leukocytes, UA: NEGATIVE
Nitrite, UA: NEGATIVE
POC,PROTEIN,UA: NEGATIVE
Spec Grav, UA: 1.01
Urobilinogen, UA: 0.2 U/dL
pH, UA: 7

## 2018-08-06 MED ORDER — DOXYCYCLINE HYCLATE 100 MG PO TABS: 100.0000 mg | ORAL_TABLET | Freq: Two times a day (BID) | ORAL | 0 refills | Status: DC

## 2018-08-06 MED ORDER — OMEPRAZOLE 40 MG PO CPDR: 40.0000 mg | DELAYED_RELEASE_CAPSULE | Freq: Every day | ORAL | 6 refills | Status: DC

## 2018-08-06 MED FILL — OMEPRAZOLE DR 40 MG CAPSULE: 40 | 30 days supply | Qty: 30 | Fill #0

## 2018-08-06 MED FILL — DOXYCYCLINE HYCLATE 100 MG: 100 | 10 days supply | Qty: 20 | Fill #0

## 2018-08-06 NOTE — Telephone Encounter (Signed)
Patient was called, a family member answered and stated that he was not currently in. Asked if they could ask the patient to give a call back at earliest convenience. If patient returns call Please notify patient of normal urinalysis

## 2018-08-06 NOTE — Progress Notes (Signed)
Flu shot yes  Pain back , sob, chest pain comes and goes  Patient fell, getting out bed, hit head 3days ago

## 2018-08-06 NOTE — Telephone Encounter (Signed)
-----   Message from [name] [name], MD sent at 08/06/2018  3:14 PM EDT ----- Please notify patient of normal urinalysis 
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
## 2018-08-06 NOTE — Telephone Encounter (Signed)
-----   Message from Cain Saupe, MD sent at 08/06/2018  3:14 PM EDT ----- Please notify patient of normal urinalysis

## 2018-08-07 LAB — CBC WITH DIFFERENTIAL/PLATELET
Basophils Absolute: 0.1 x10E3/uL (ref 0.0–0.2)
Basos: 1 %
EOS (ABSOLUTE): 0.2 x10E3/uL (ref 0.0–0.4)
Eos: 1 %
Hematocrit: 50.6 % (ref 37.5–51.0)
Hemoglobin: 17.8 g/dL — ABNORMAL HIGH (ref 13.0–17.7)
Immature Grans (Abs): 0.1 x10E3/uL (ref 0.0–0.1)
Immature Granulocytes: 1 %
Lymphocytes Absolute: 2 x10E3/uL (ref 0.7–3.1)
Lymphs: 17 %
MCH: 31.4 pg (ref 26.6–33.0)
MCHC: 35.2 g/dL (ref 31.5–35.7)
MCV: 89 fL (ref 79–97)
Monocytes Absolute: 0.8 x10E3/uL (ref 0.1–0.9)
Monocytes: 7 %
Neutrophils Absolute: 8.6 x10E3/uL — ABNORMAL HIGH (ref 1.4–7.0)
Neutrophils: 73 %
Platelets: 176 x10E3/uL (ref 150–450)
RBC: 5.66 x10E6/uL (ref 4.14–5.80)
RDW: 13 % (ref 12.3–15.4)
WBC: 11.8 x10E3/uL — ABNORMAL HIGH (ref 3.4–10.8)

## 2018-08-07 LAB — COMPREHENSIVE METABOLIC PANEL WITH GFR
ALT: 30 IU/L (ref 0–44)
AST: 26 IU/L (ref 0–40)
Albumin/Globulin Ratio: 2.3 — ABNORMAL HIGH (ref 1.2–2.2)
Albumin: 4.3 g/dL (ref 3.5–5.5)
Alkaline Phosphatase: 84 IU/L (ref 39–117)
BUN/Creatinine Ratio: 9 (ref 9–20)
BUN: 10 mg/dL (ref 6–20)
Bilirubin Total: 0.4 mg/dL (ref 0.0–1.2)
CO2: 24 mmol/L (ref 20–29)
Calcium: 8.9 mg/dL (ref 8.7–10.2)
Chloride: 101 mmol/L (ref 96–106)
Creatinine, Ser: 1.06 mg/dL (ref 0.76–1.27)
GFR calc Af Amer: 102 mL/min/1.73
GFR calc non Af Amer: 89 mL/min/1.73
Globulin, Total: 1.9 g/dL (ref 1.5–4.5)
Glucose: 95 mg/dL (ref 65–99)
Potassium: 5.2 mmol/L (ref 3.5–5.2)
Sodium: 137 mmol/L (ref 134–144)
Total Protein: 6.2 g/dL (ref 6.0–8.5)

## 2018-08-22 ENCOUNTER — Institutional Professional Consult (permissible substitution): Payer: Medicaid Other | Admitting: Pulmonary Disease

## 2018-08-22 NOTE — Progress Notes (Deleted)
Synopsis: Referred in September 2019 for shortness of breath by Cain Saupe, MD  Subjective:   PATIENT ID: Douglas Guzman GENDER: male DOB: 1980/12/13, MRN: [id number]  No chief complaint on file.   HPI  ***  No past medical history on file.   No family history on file.   Past Surgical History:  Procedure Laterality Date  . BRAIN SURGERY      Social History   Socioeconomic History  . Marital status: Single    Spouse name: Not on file  . Number of children: Not on file  . Years of education: Not on file  . Highest education level: Not on file  Occupational History  . Not on file  Social Needs  . Financial resource strain: Not on file  . Food insecurity:    Worry: Not on file    Inability: Not on file  . Transportation needs:    Medical: Not on file    Non-medical: Not on file  Tobacco Use  . Smoking status: Current Every Day Smoker    Packs/day: 2.00    Types: Cigarettes  . Smokeless tobacco: Never Used  Substance and Sexual Activity  . Alcohol use: Yes  . Drug use: Never  . Sexual activity: Yes  Lifestyle  . Physical activity:    Days per week: Not on file    Minutes per session: Not on file  . Stress: Not on file  Relationships  . Social connections:    Talks on phone: Not on file    Gets together: Not on file    Attends religious service: Not on file    Active member of club or organization: Not on file    Attends meetings of clubs or organizations: Not on file    Relationship status: Not on file  . Intimate partner violence:    Fear of current or ex partner: Not on file    Emotionally abused: Not on file    Physically abused: Not on file    Forced sexual activity: Not on file  Other Topics Concern  . Not on file  Social History Narrative  . Not on file     No Known Allergies   Outpatient Medications Prior to Visit  Medication Sig Dispense Refill  . bacitracin ointment Apply 1 application topically 3 (three) times daily. (Patient not  taking: Reported on 08/06/2018) 28 g 1  . doxycycline (VIBRA-TABS) 100 MG tablet Take 1 tablet (100 mg total) by mouth 2 (two) times daily. 20 tablet 0  . famotidine (PEPCID) 20 MG tablet Take 1 tablet (20 mg total) by mouth 2 (two) times daily. (Patient not taking: Reported on 08/06/2018) 30 tablet 0  . ibuprofen (ADVIL,MOTRIN) 800 MG tablet Take 1 tablet (800 mg total) by mouth 3 (three) times daily. (Patient not taking: Reported on 08/06/2018) 21 tablet 0  . methocarbamol (ROBAXIN) 500 MG tablet Take 1 tablet (500 mg total) by mouth 2 (two) times daily. (Patient not taking: Reported on 08/06/2018) 20 tablet 0  . naproxen (NAPROSYN) 500 MG tablet Take 1 tablet (500 mg total) by mouth 2 (two) times daily. (Patient not taking: Reported on 08/06/2018) 30 tablet 0  . omeprazole (PRILOSEC) 40 MG capsule Take 1 capsule (40 mg total) by mouth daily. 30 capsule 6   No facility-administered medications prior to visit.     ROS   Objective:  Physical Exam   There were no vitals filed for this visit.   on *** LPM ***  RA BMI Readings from Last 3 Encounters:  08/06/18 20.79 kg/m  01/18/18 22.05 kg/m  05/29/17 22.05 kg/m   Wt Readings from Last 3 Encounters:  08/06/18 140 lb 12.8 oz (63.9 kg)  01/18/18 145 lb (65.8 kg)  05/29/17 145 lb (65.8 kg)     CBC    Component Value Date/Time   WBC 11.8 (H) 08/06/2018 1159   WBC 10.1 06/05/2018 1349   RBC 5.66 08/06/2018 1159   RBC 5.26 06/05/2018 1349   HGB 17.8 (H) 08/06/2018 1159   HCT 50.6 08/06/2018 1159   PLT 176 08/06/2018 1159   MCV 89 08/06/2018 1159   MCH 31.4 08/06/2018 1159   MCH 31.4 06/05/2018 1349   MCHC 35.2 08/06/2018 1159   MCHC 33.3 06/05/2018 1349   RDW 13.0 08/06/2018 1159   LYMPHSABS 2.0 08/06/2018 1159   MONOABS 1.2 (H) 04/26/2009 0359   EOSABS 0.2 08/06/2018 1159   BASOSABS 0.1 08/06/2018 1159    ***  Chest Imaging: Chest x-ray 08/06/2018: No active pulmonary process The patient's images have been independently  reviewed by me.    Pulmonary Functions Testing Results: None   FeNO: None   Pathology: None   Echocardiogram: None   Heart Catheterization: None     Assessment & Plan:   SOB (shortness of breath)  Discussion: ***   Current Outpatient Medications:  .  bacitracin ointment, Apply 1 application topically 3 (three) times daily. (Patient not taking: Reported on 08/06/2018), Disp: 28 g, Rfl: 1 .  doxycycline (VIBRA-TABS) 100 MG tablet, Take 1 tablet (100 mg total) by mouth 2 (two) times daily., Disp: 20 tablet, Rfl: 0 .  famotidine (PEPCID) 20 MG tablet, Take 1 tablet (20 mg total) by mouth 2 (two) times daily. (Patient not taking: Reported on 08/06/2018), Disp: 30 tablet, Rfl: 0 .  ibuprofen (ADVIL,MOTRIN) 800 MG tablet, Take 1 tablet (800 mg total) by mouth 3 (three) times daily. (Patient not taking: Reported on 08/06/2018), Disp: 21 tablet, Rfl: 0 .  methocarbamol (ROBAXIN) 500 MG tablet, Take 1 tablet (500 mg total) by mouth 2 (two) times daily. (Patient not taking: Reported on 08/06/2018), Disp: 20 tablet, Rfl: 0 .  naproxen (NAPROSYN) 500 MG tablet, Take 1 tablet (500 mg total) by mouth 2 (two) times daily. (Patient not taking: Reported on 08/06/2018), Disp: 30 tablet, Rfl: 0 .  omeprazole (PRILOSEC) 40 MG capsule, Take 1 capsule (40 mg total) by mouth daily., Disp: 30 capsule, Rfl: 6   Josephine Igo, DO [facility] 08/22/2018 8:47 AM

## 2018-08-25 ENCOUNTER — Telehealth: Payer: Self-pay | Admitting: *Deleted

## 2018-08-25 ENCOUNTER — Ambulatory Visit: Payer: Medicaid Other | Admitting: Family Medicine

## 2018-08-25 NOTE — Telephone Encounter (Signed)
Patient verified DOB Patient is aware of chest xray being normal and states he still has a few antibiotics left. MA advised patient to complete the script. No further questions.

## 2018-08-25 NOTE — Telephone Encounter (Signed)
-----   Message from Cain Saupe, MD sent at 08/07/2018 12:13 PM EDT ----- Notify patient of normal Chest Xray but he should take the prescribed antibiotics

## 2018-08-26 ENCOUNTER — Ambulatory Visit: Payer: Medicaid Other | Admitting: Family Medicine

## 2018-09-02 ENCOUNTER — Encounter: Payer: Self-pay | Admitting: Internal Medicine

## 2018-09-02 ENCOUNTER — Ambulatory Visit (INDEPENDENT_AMBULATORY_CARE_PROVIDER_SITE_OTHER): Payer: Medicaid Other | Admitting: Internal Medicine

## 2018-09-02 VITALS — BP 120/66 | HR 60 | Ht 68.0 in | Wt 138.0 lb

## 2018-09-02 DIAGNOSIS — J449 Chronic obstructive pulmonary disease, unspecified: Secondary | ICD-10-CM

## 2018-09-02 DIAGNOSIS — K219 Gastro-esophageal reflux disease without esophagitis: Secondary | ICD-10-CM

## 2018-09-02 DIAGNOSIS — F17210 Nicotine dependence, cigarettes, uncomplicated: Secondary | ICD-10-CM | POA: Insufficient documentation

## 2018-09-02 DIAGNOSIS — R0609 Other forms of dyspnea: Secondary | ICD-10-CM | POA: Insufficient documentation

## 2018-09-02 MED ORDER — BUDESONIDE-FORMOTEROL FUMARATE 160-4.5 MCG/ACT IN AERO: 2.0000 | INHALATION_SPRAY | Freq: Two times a day (BID) | RESPIRATORY_TRACT | 11 refills | Status: DC

## 2018-09-02 MED ORDER — BUDESONIDE-FORMOTEROL FUMARATE 160-4.5 MCG/ACT IN AERO: 2.0000 | INHALATION_SPRAY | Freq: Two times a day (BID) | RESPIRATORY_TRACT | 0 refills | Status: DC

## 2018-09-02 MED ORDER — OMEPRAZOLE 40 MG PO CPDR: DELAYED_RELEASE_CAPSULE | ORAL | Status: DC

## 2018-09-02 NOTE — Assessment & Plan Note (Addendum)
09/02/2018   Walked RA x one lap @ 185 stopped due to  Sob/ no desat   Symptoms are markedly disproportionate to objective findings and not clear to what extent this is actually a pulmonary  problem but pt does appear to have difficult to sort out respiratory symptoms of unknown origin for which  DDX  = almost all start with A and  include Adherence, Ace Inhibitors, Acid Reflux, Active Sinus Disease, Alpha 1 Antitripsin deficiency, Anxiety masquerading as Airways dz,  ABPA,  Allergy(esp in young), Aspiration (esp in elderly), Adverse effects of meds,  Active smokers, A bunch of PE's/clot burden (a few small clots can't cause this syndrome unless there is already severe underlying pulm or vascular dz with poor reserve),  Anemia or thyroid disorder, plus two Bs  = Bronchiectasis and Beta blocker use..and one C= CHF      Adherence is always the initial "prime suspect" and is a multilayered concern that requires a "trust but verify" approach in every patient - starting with knowing how to use medications, especially inhalers, correctly, keeping up with refills and understanding the fundamental difference between maintenance and prns vs those medications only taken for a very short course and then stopped and not refilled.  - see hfa teaching - with all meds in hand using a trust but verify approach to confirm accurate Medication  Reconciliation The principal here is that until we are certain that the  patients are doing what we've asked, it makes no sense to ask them to do more.   Active smoking also at top of list (see separate a/p)   ? Acid (or non-acid) GERD > always difficult to exclude as up to 75% of pts in some series report no assoc GI/ Heartburn symptoms> rec continue max (24h)  acid suppression and stop naprosyn as cp also likely due to gerd and not mscp  ? Allergy/ asthma > high dose symbiocrt pending full pfts in f/u   ? Alpha one AT def > send screen  ? Anxiety/depression deconditioning  >  usually at the bottom of this list of usual suspects but should be   higher on this pt's based on H and P and note   and may interfere with adherence and also interpretation of response or lack thereof to symptom management which can be quite subjective.     ? Anemia/ thyroid dz >   ? CHF > send echo

## 2018-09-02 NOTE — Assessment & Plan Note (Signed)
Spirometry 09/02/2018  FEV1 2.5 (62%)  Ratio 67 s prior rx - 09/02/2018  After extensive coaching inhaler device,  effectiveness = 75%     >> try symbicort 160 2bid   - alpha one AT screen  09/02/2018     Total time devoted to counseling  > 50 % of initial 60 min office visit:  review case with pt/ discussion of options/alternatives/ personally creating written customized instructions  in presence of pt  then going over those specific  Instructions directly with the pt including how to use all of the meds but in particular covering each new medication in detail and the difference between the maintenance= "automatic" meds and the prns using an action plan format for the latter (If this problem/symptom => do that organization reading Left to right).  Please see AVS from this visit for a full list of these instructions which I personally wrote for this pt and  are unique to this visit.  See device teaching which extended face to face time for this visit

## 2018-09-02 NOTE — Progress Notes (Signed)
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 Pulmonary Consult    Referred by Dr Cain Saupe.  Pt c/o SOB over the past 2 yrs, progressively getting worse. He states he gets SOB when he lies flat and has to sleep propped up. He states that he gets winded walking to his mailbox.   Dyspnea:  Indolent onset progressive to point of doe x 50 ft and also orthopnea x one month prior to OV assoc with midline chest tightness not necessarily related to exertion daily for about a month> dx mscp rx aleve/ pepcid no better and then rx ppi bid ac improved  Cough: white esp in am x sev tbsp mucoid thick  Sleep: 30 degrees helps a lot  X sev months SABA use: none, says never used inhaler   No obvious day to day or daytime variability or assoc excess/ purulent sputum or mucus plugs or hemoptysis or cp or chest tightness, subjective wheeze or overt sinus symptoms.   Also denies any obvious fluctuation of symptoms with weather or environmental changes or other aggravating or alleviating factors except as outlined above   No unusual exposure hx or h/o childhood pna/ asthma or knowledge of premature birth.  Current Allergies, Complete Past Medical History, Past Surgical History, Family History, and Social History were reviewed in [employer] record.  ROS  The following are not active complaints unless bolded Hoarseness, sore throat, dysphagia, dental problems, itching, sneezing,  nasal congestion or discharge of excess mucus or purulent secretions, ear ache,   fever, chills, sweats, unintended wt loss or wt gain, classically pleuritic or exertional cp,  orthopnea pnd or arm/hand  swelling  or leg swelling, presyncope, palpitations, abdominal pain, anorexia, nausea, vomiting, diarrhea  or change in bowel habits or change in bladder habits, change in stools or change in urine, dysuria, hematuria,  rash, arthralgias, visual complaints, headache, numbness, weakness or ataxia or problems with walking or coordination,  change in mood= depression or  memory.           No past medical history on file.  Outpatient Medications Prior to Visit  Medication Sig Dispense Refill  . doxycycline (VIBRA-TABS) 100 MG tablet Take 1 tablet (100 mg total) by mouth 2 (two) times daily. 20 tablet 0  . omeprazole (PRILOSEC) 40 MG capsule Take 1 capsule (40 mg total) by mouth daily. 30 capsule 6  . bacitracin ointment Apply 1 application topically 3 (three) times daily. (Patient not taking: Reported on 08/06/2018) 28 g 1  . famotidine (PEPCID) 20 MG tablet Take 1 tablet (20 mg total) by mouth 2 (two) times daily. (Patient not taking: Reported on 08/06/2018) 30 tablet 0  . ibuprofen (ADVIL,MOTRIN) 800 MG tablet Take 1 tablet (800 mg total) by mouth 3 (three) times daily. (Patient not taking: Reported on 08/06/2018) 21 tablet 0  . methocarbamol (ROBAXIN) 500 MG tablet Take 1 tablet (500 mg total) by mouth 2 (two) times daily. (Patient not taking: Reported on 08/06/2018) 20 tablet 0  . naproxen (NAPROSYN) 500 MG tablet Take 1 tablet (500 mg total) by mouth 2 (two) times daily. (Patient not  taking: Reported on 08/06/2018) 30 tablet 0            Objective:     BP 120/66 (BP Location: Left Arm, Cuff Size: Normal)   Pulse 60   Ht 5\' 8"  (1.727 m)   Wt 138 lb (62.6 kg)   SpO2 97%   BMI 20.98 kg/m   SpO2: 97 %  RA   Thin amb wm >> stated age     HEENT:  Edentulous with nl  oropharynx. Nl external ear canals without cough reflex -  Mild bilateral non-specific turbinate edema     NECK :  without JVD/Nodes/TM/ nl carotid upstrokes bilaterally   LUNGS: no acc muscle use,  Mild barrel  contour  chest wall with bilateral  Distant bs s audible wheeze and  without cough on insp or exp maneuver and mild  Hyperresonant  to  percussion bilaterally     CV:  RRR  no s3 or murmur or increase in P2, and no edema   ABD:  soft and nontender with pos late  insp Hoover's  in the supine position. No bruits or organomegaly appreciated, bowel sounds nl  MS:   Nl gait/  ext warm without deformities, calf tenderness, cyanosis or clubbing No obvious joint restrictions   SKIN: warm and dry without lesions    NEURO:  alert, approp, nl sensorium with  no motor or cerebellar deficits apparent.      Labs ordered 09/02/2018  Alpha one testing    I personally reviewed images and agree with radiology impression as follows:  CXR:   08/06/18  No active cardiopulmonary disease.    Assessment   DOE (dyspnea on exertion) 09/02/2018   Walked RA x one lap @ 185 stopped due to  Sob/ no desat   Symptoms are markedly disproportionate to objective findings and not clear to what extent this is actually a pulmonary  problem but pt does appear to have difficult to sort out respiratory symptoms of unknown origin for which  DDX  = almost all start with A and  include Adherence, Ace Inhibitors, Acid Reflux, Active Sinus Disease, Alpha 1 Antitripsin deficiency, Anxiety masquerading as Airways dz,  ABPA,  Allergy(esp in young), Aspiration (esp in elderly), Adverse effects of meds,  Active smokers, A bunch of PE's/clot burden (a few small clots can't cause this syndrome unless there is already severe underlying pulm or vascular dz with poor reserve),  Anemia or thyroid disorder, plus two Bs  = Bronchiectasis and Beta blocker use..and one C= CHF      Adherence is always the initial "prime suspect" and is a multilayered concern that requires a "trust but verify" approach in every patient - starting with knowing how to use medications, especially inhalers, correctly, keeping up with refills and understanding the fundamental  difference between maintenance and prns vs those medications only taken for a very short course and then stopped and not refilled.  - see hfa teaching - with all meds in hand using a trust but verify approach to confirm accurate Medication  Reconciliation The principal here is that until we are certain that the  patients are doing what we've asked, it makes no sense to ask them to do more.   Active smoking also at top of list (see separate a/p)   ? Acid (or non-acid) GERD > always difficult to exclude as up to 75% of pts in some series report no assoc GI/ Heartburn symptoms> rec continue max (24h)  acid suppression  and stop naprosyn as cp also likely due to gerd and not mscp  ? Allergy/ asthma > high dose symbiocrt pending full pfts in f/u   ? Alpha one AT def > send screen  ? Anxiety/depression deconditioning  > usually at the bottom of this list of usual suspects but should be   higher on this pt's based on H and P and note   and may interfere with adherence and also interpretation of response or lack thereof to symptom management which can be quite subjective.     ? Anemia/ thyroid dz >   ? CHF > send echo     Cigarette smoker 4-5 min discussion re active cigarette smoking in addition to office E&M  Ask about tobacco use:   Active  Advise quitting     I reviewed the Fletcher curve with the patient that basically indicates  if you quit smoking when your best day FEV1 is still well preserved (as is clearly  the case here)  it is highly unlikely you will progress to severe disease and informed the patient there was  no medication on the market that has proven to alter the curve/ its downward trajectory  or the likelihood of progression of their disease(unlike other chronic medical conditions such as atheroclerosis where we do think we can change the natural hx with risk reducing meds)    Therefore stopping smoking and maintaining abstinence are  the most important aspects of care, not  choice of inhalers or for that matter, doctors.  Treatment other than smoking cessation  is entirely directed by severity of symptoms and focused also on reducing exacerbations, not attempting to change the natural history of the disease.   (inhalers are like high octane fuel to a car engine developing too much wear on it's valves, it won't make the engine run longer, just perform some better)  Assess willingness:  Not committed at this point Assist in quit attempt:  Per PCP when ready Arrange follow up:   Follow up per Primary Care planned        COPD GOLD II/ still smoking  Spirometry 09/02/2018  FEV1 2.5 (62%)  Ratio 67 s prior rx - 09/02/2018  After extensive coaching inhaler device,  effectiveness = 75%     >> try symbicort 160 2bid   - alpha one AT screen  09/02/2018     Total time devoted to counseling  > 50 % of initial 60 min office visit:  review case with pt/ discussion of options/alternatives/ personally creating written customized instructions  in presence of pt  then going over those specific  Instructions directly with the pt including how to use all of the meds but in particular covering each new medication in detail and the difference between the maintenance= "automatic" meds and the prns using an action plan format for the latter (If this problem/symptom => do that organization reading Left to right).  Please see AVS from this visit for a full list of these instructions which I personally wrote for this pt and  are unique to this visit.  See device teaching which extended face to face time for this visit      Sandrea Hughs, MD 09/02/2018

## 2018-09-02 NOTE — Patient Instructions (Addendum)
Stop naprosyn but continue the prilosec (omeprazole)  Take 30- 60 min before your first and last meals of the day    Symbicort 160 Take 2 puffs first thing in am and then another 2 puffs about 12 hours later.      Work on inhaler technique:  relax and gently blow all the way out then take a nice smooth deep breath back in, triggering the inhaler at same time you start breathing in.  Hold for up to 5 seconds if you can. Blow out thru nose. Rinse and gargle with water when done      Please schedule a follow up office visit in 6 weeks, call sooner if needed with all medications /inhalers/ solutions in hand so we can verify exactly what you are taking. This includes all medications from all doctors and over the counters  - PFT's on return

## 2018-09-02 NOTE — Assessment & Plan Note (Signed)
4-5 min discussion re active cigarette smoking in addition to office E&M  Ask about tobacco use:   Active  Advise quitting     I reviewed the Fletcher curve with the patient that basically indicates  if you quit smoking when your best day FEV1 is still well preserved (as is clearly  the case here)  it is highly unlikely you will progress to severe disease and informed the patient there was  no medication on the market that has proven to alter the curve/ its downward trajectory  or the likelihood of progression of their disease(unlike other chronic medical conditions such as atheroclerosis where we do think we can change the natural hx with risk reducing meds)    Therefore stopping smoking and maintaining abstinence are  the most important aspects of care, not choice of inhalers or for that matter, doctors.  Treatment other than smoking cessation  is entirely directed by severity of symptoms and focused also on reducing exacerbations, not attempting to change the natural history of the disease.   (inhalers are like high octane fuel to a car engine developing too much wear on it's valves, it won't make the engine run longer, just perform some better)  Assess willingness:  Not committed at this point Assist in quit attempt:  Per PCP when ready Arrange follow up:   Follow up per Primary Care planned

## 2018-09-03 ENCOUNTER — Encounter: Payer: Self-pay | Admitting: Internal Medicine

## 2018-09-25 MED FILL — OMEPRAZOLE DR 40 MG CAPSULE: 40 | 30 days supply | Qty: 30 | Fill #1

## 2018-10-09 ENCOUNTER — Encounter: Payer: Self-pay | Admitting: Family Medicine

## 2018-10-09 ENCOUNTER — Ambulatory Visit: Payer: Self-pay | Attending: Family Medicine | Admitting: Family Medicine

## 2018-10-09 VITALS — BP 140/78 | HR 73 | Temp 98.5°F | Ht 69.0 in | Wt 137.4 lb

## 2018-10-09 DIAGNOSIS — R55 Syncope and collapse: Secondary | ICD-10-CM | POA: Insufficient documentation

## 2018-10-09 DIAGNOSIS — Z8249 Family history of ischemic heart disease and other diseases of the circulatory system: Secondary | ICD-10-CM | POA: Insufficient documentation

## 2018-10-09 DIAGNOSIS — J449 Chronic obstructive pulmonary disease, unspecified: Secondary | ICD-10-CM | POA: Insufficient documentation

## 2018-10-09 DIAGNOSIS — M4184 Other forms of scoliosis, thoracic region: Secondary | ICD-10-CM | POA: Insufficient documentation

## 2018-10-09 DIAGNOSIS — F17210 Nicotine dependence, cigarettes, uncomplicated: Secondary | ICD-10-CM | POA: Insufficient documentation

## 2018-10-09 DIAGNOSIS — M546 Pain in thoracic spine: Secondary | ICD-10-CM

## 2018-10-09 DIAGNOSIS — M545 Low back pain, unspecified: Secondary | ICD-10-CM

## 2018-10-09 DIAGNOSIS — M419 Scoliosis, unspecified: Secondary | ICD-10-CM

## 2018-10-09 DIAGNOSIS — M5440 Lumbago with sciatica, unspecified side: Secondary | ICD-10-CM

## 2018-10-09 DIAGNOSIS — G8929 Other chronic pain: Secondary | ICD-10-CM

## 2018-10-09 MED ORDER — IBUPROFEN 600 MG PO TABS: 600.0000 mg | ORAL_TABLET | Freq: Three times a day (TID) | ORAL | 3 refills | Status: DC | PRN

## 2018-10-09 MED ORDER — METHOCARBAMOL 500 MG PO TABS: 500.0000 mg | ORAL_TABLET | Freq: Every evening | ORAL | 3 refills | Status: DC | PRN

## 2018-10-09 NOTE — Progress Notes (Signed)
Patient is here for 2 week follow-up. Pt. Stated he have an upcoming appt. To follow-up with his pulmonologist.   Pt. Stated he still have shortness of breath and the inhaler that his Pulmonologist prescribed for him helps him.

## 2018-10-09 NOTE — Patient Instructions (Signed)

## 2018-10-09 NOTE — Progress Notes (Signed)
Subjective:    Patient ID: Douglas Guzman, male    DOB: 14-Jul-1981, 37 y.o.   MRN: [id number]  HPI 37 yo male seen in follow-up of recurrent syncopal episodes, mid and low back pain as well as COPD.  Patient states that since his last visit here he has seen pulmonology and was placed on Symbicort which has been helping.  Patient has had no further syncopal episodes.  Patient continues to have pain in his mid and low back.  Patient states that the pain in his mid back is about a 7 on a 0-to-10 scale but can sometimes be greater.  Patient states that the pain is worse if he is reaching up/leaning forward.  Pain is a constant dull ache but can also be sharp.  Patient states that his mid back pain is chronic.  Patient also with pain in the right lower back that comes and goes.  Pain sometimes has burning/sharp quality that radiates into the right buttock/right upper leg.  This pain is not present at today's visit.    Patient started a job this week and states that the job has caused him to have an increase in his back pain.      Patient reports that his COPD/breathing is better after starting Symbicort which was prescribed by pulmonology.  Patient however does report that he continues to smoke.  Patient denies any current productive cough and no fever chills.  Patient has had no further syncopal episodes. Past Medical History:  Diagnosis Date  . COPD (chronic obstructive pulmonary disease) (HCC)   . Tobacco dependence    Past Surgical History:  Procedure Laterality Date  . BRAIN SURGERY    . INGUINAL HERNIA REPAIR     occurred in childhood   Family History  Problem Relation Age of Onset  . Diabetes Mother   . COPD Mother   . CAD Father   . Throat cancer Father    Social History   Tobacco Use  . Smoking status: Current Every Day Smoker    Packs/day: 1.50    Years: 26.00    Pack years: 39.00    Types: Cigarettes  . Smokeless tobacco: Never Used  Substance Use Topics  . Alcohol use: Yes   . Drug use: Never  No Known Allergies   Review of Systems  Constitutional: Positive for fatigue. Negative for chills and fever.  Respiratory: Positive for shortness of breath (Improved with use of Symbicort). Negative for cough.   Cardiovascular: Negative for chest pain, palpitations and leg swelling.  Gastrointestinal: Negative for abdominal pain and nausea.  Genitourinary: Negative for dysuria and frequency.  Musculoskeletal: Positive for back pain and myalgias. Negative for arthralgias, gait problem and joint swelling.  Neurological: Negative for dizziness and headaches.       Objective:   Physical Exam BP 140/78 (BP Location: Right Arm, Patient Position: Sitting, Cuff Size: Normal)   Pulse 73   Temp 98.5 F (36.9 C) (Oral)   Ht 5\' 9"  (1.753 m)   Wt 137 lb 6.4 oz (62.3 kg)   SpO2 98%   BMI 20.29 kg/m Nurse's notes and vital signs reviewed General- thin framed male who appears older than his stated age, who is in no acute distress Lungs-clear to auscultation bilaterally Cardiovascular-regular rate and rhythm Back-no CVA tenderness.  Patient has thoracic scoliosis with thoracic and lumbar paraspinous spasm.  Patient with mild lumbosacral and right SI joint discomfort to palpation.  Patient with tenderness in the mid thoracic spine  area       Assessment & Plan:  1. Chronic midline thoracic back pain Patient was scoliosis in the thoracic spine area which is the most likely cause of his pain.  Patient will be referred to physical therapy for further evaluation and treatment.  Patient provided with prescription for ibuprofen 600 mg to take up to every 6 hours as needed for pain.  Patient is aware that he needs to take the medication after eating to avoid stomach upset.  Prescription also provided for Robaxin to take at bedtime as needed for muscle spasm.  Patient is aware the medication may cause drowsiness. - Ambulatory referral to Physical Therapy - ibuprofen (ADVIL,MOTRIN) 600  MG tablet; Take 1 tablet (600 mg total) by mouth every 8 (eight) hours as needed. Take after eating  Dispense: 60 tablet; Refill: 3 - methocarbamol (ROBAXIN) 500 MG tablet; Take 1 tablet (500 mg total) by mouth at bedtime as needed for muscle spasms.  Dispense: 30 tablet; Refill: 3  2. Scoliosis of thoracic spine, unspecified scoliosis type Patient with thoracic spine scoliosis which is the likely cause of his chronic mid back pain.  Patient provided with prescriptions for ibuprofen and Robaxin and patient is being referred to physical therapy for further evaluation and treatment. - Ambulatory referral to Physical Therapy - ibuprofen (ADVIL,MOTRIN) 600 MG tablet; Take 1 tablet (600 mg total) by mouth every 8 (eight) hours as needed. Take after eating  Dispense: 60 tablet; Refill: 3 - methocarbamol (ROBAXIN) 500 MG tablet; Take 1 tablet (500 mg total) by mouth at bedtime as needed for muscle spasms.  Dispense: 30 tablet; Refill: 3  3. Low back pain with radiation Patient with complaint of low back pain with occasional radiation.  Patient had recent lumbar spine films which were normal.  Prescription provided for ibuprofen and Robaxin to take as needed for pain and muscle spasm.  Patient is being referred to physical therapy for further evaluation and treatment. - Ambulatory referral to Physical Therapy - ibuprofen (ADVIL,MOTRIN) 600 MG tablet; Take 1 tablet (600 mg total) by mouth every 8 (eight) hours as needed. Take after eating  Dispense: 60 tablet; Refill: 3 - methocarbamol (ROBAXIN) 500 MG tablet; Take 1 tablet (500 mg total) by mouth at bedtime as needed for muscle spasms.  Dispense: 30 tablet; Refill: 3  An After Visit Summary was printed and given to the patient.  Return in about 3 months (around 01/09/2019) for back pain/COPD and as needed.

## 2018-10-14 ENCOUNTER — Ambulatory Visit: Payer: Medicaid Other | Admitting: Internal Medicine

## 2018-10-15 ENCOUNTER — Ambulatory Visit: Payer: Medicaid Other | Admitting: Physical Therapy

## 2018-10-17 ENCOUNTER — Ambulatory Visit: Payer: Medicaid Other | Admitting: Internal Medicine

## 2018-10-17 MED FILL — IBUPROFEN 600 MG TABLET: 600 | 20 days supply | Qty: 60 | Fill #0

## 2018-10-17 MED FILL — METHOCARBAMOL 500 MG TABS: 500 | 30 days supply | Qty: 30 | Fill #0

## 2018-10-17 MED FILL — SYMBICORT 160-4.5 MCG INH: 160-4.5 | 30 days supply | Qty: 10 | Fill #0

## 2018-10-21 ENCOUNTER — Ambulatory Visit: Payer: Self-pay | Admitting: Internal Medicine

## 2018-10-22 ENCOUNTER — Encounter: Payer: Self-pay | Admitting: Internal Medicine

## 2018-10-27 ENCOUNTER — Encounter (HOSPITAL_COMMUNITY): Payer: Self-pay | Admitting: *Deleted

## 2018-10-27 ENCOUNTER — Ambulatory Visit (HOSPITAL_COMMUNITY)
Admission: EM | Admit: 2018-10-27 | Discharge: 2018-10-27 | Disposition: A | Discharge status: Home / Self Care | Payer: Medicaid Other | Attending: Family Medicine | Admitting: Family Medicine

## 2018-10-27 ENCOUNTER — Encounter (HOSPITAL_COMMUNITY): Payer: Self-pay

## 2018-10-27 ENCOUNTER — Emergency Department (HOSPITAL_COMMUNITY)
Admission: EM | Admit: 2018-10-27 | Discharge: 2018-10-27 | Disposition: A | Discharge status: Home / Self Care | Payer: Medicaid Other | Attending: Emergency Medicine | Admitting: Emergency Medicine

## 2018-10-27 ENCOUNTER — Other Ambulatory Visit: Payer: Self-pay

## 2018-10-27 DIAGNOSIS — G8929 Other chronic pain: Secondary | ICD-10-CM

## 2018-10-27 DIAGNOSIS — M5441 Lumbago with sciatica, right side: Secondary | ICD-10-CM

## 2018-10-27 DIAGNOSIS — M549 Dorsalgia, unspecified: Secondary | ICD-10-CM | POA: Insufficient documentation

## 2018-10-27 DIAGNOSIS — Z5321 Procedure and treatment not carried out due to patient leaving prior to being seen by health care provider: Secondary | ICD-10-CM | POA: Insufficient documentation

## 2018-10-27 MED ORDER — KETOROLAC TROMETHAMINE 30 MG/ML IJ SOLN
30.0000 mg | Freq: Once | INTRAMUSCULAR | Status: AC
  Administered 2018-10-27: 30 mg via INTRAMUSCULAR

## 2018-10-27 MED ORDER — KETOROLAC TROMETHAMINE 30 MG/ML IJ SOLN
INTRAMUSCULAR | Status: AC
  Filled 2018-10-27: qty 1

## 2018-10-27 MED ORDER — TRAMADOL HCL 50 MG PO TABS: 50.0000 mg | ORAL_TABLET | Freq: Two times a day (BID) | ORAL | 0 refills | Status: DC | PRN

## 2018-10-27 NOTE — ED Triage Notes (Signed)
Pt cc pt states he has had back and neck pain for 2 years. And some right leg pain few months.

## 2018-10-27 NOTE — ED Triage Notes (Signed)
Pt in c/o neck and back pain that is worse with movement, states he does a lot of repetitive motions at work and that worsens pain, no distress noted, went to his MD office and was told he had scoliosis

## 2018-10-27 NOTE — Discharge Instructions (Addendum)
We will give you a Toradol injection here for your back pain. You can continue the muscle relaxers that you have at home as needed I will give you a few tramadol to help with more severe pain Please follow-up with your doctor for further management

## 2018-10-28 MED FILL — traMADol HCL 50 MG TABS: 50 | 3 days supply | Qty: 6 | Fill #0

## 2018-10-28 NOTE — ED Provider Notes (Signed)
[facility]    CSN: [id number] Arrival date & time: 10/27/18  1829     History   Chief Complaint Chief Complaint  Patient presents with  . Back Pain  . Neck Pain    HPI Douglas Guzman is a 37 y.o. male.   Patient is a 37 year old male that presents today with chronic back pain.  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  His pain has worsened since yesterday.  He has been taking ibuprofen and muscle relaxants without much relief of pain.  Reports most of the pain is located in the upper right thoracic area but he is also having pain in the lower right lumbar area with some pain radiating into his right buttocks and upper thigh.  He denies any associated numbness, tingling, weakness, saddle paresthesias, loss of bowel or bladder function.  He denies any fevers.  ROS per HPI      Past Medical History:  Diagnosis Date  . COPD (chronic obstructive pulmonary disease) (HCC)   . Tobacco dependence     Patient Active Problem List   Diagnosis Date Noted  . DOE (dyspnea on exertion) 09/02/2018  . COPD GOLD II/ still smoking  09/02/2018  . Cigarette smoker 09/02/2018    Past Surgical History:  Procedure Laterality Date  . BRAIN SURGERY    . INGUINAL HERNIA REPAIR     occurred in childhood       Home Medications    Prior to Admission medications   Medication Sig Start Date End Date Taking? Authorizing Provider  budesonide-formoterol (SYMBICORT) 160-4.5 MCG/ACT inhaler Inhale 2 puffs into the lungs 2 (two) times daily. 09/02/18   Nyoka CowdenWert, Michael B, MD  budesonide-formoterol (SYMBICORT) 160-4.5 MCG/ACT inhaler Inhale 2 puffs into the lungs 2 (two) times daily. Patient not taking: Reported on 10/09/2018 09/02/18   Nyoka CowdenWert, Michael B, MD  doxycycline (VIBRA-TABS) 100 MG tablet Take 1 tablet (100 mg total) by mouth 2 (two) times daily. 08/06/18   [name], [name], MD  ibuprofen (ADVIL,MOTRIN) 600 MG  tablet Take 1 tablet (600 mg total) by mouth every 8 (eight) hours as needed. Take after eating 10/09/18   [name], [name], MD  methocarbamol (ROBAXIN) 500 MG tablet Take 1 tablet (500 mg total) by mouth at bedtime as needed for muscle spasms. 10/09/18   [name], [name], MD  omeprazole (PRILOSEC) 40 MG capsule Take 30- 60 min before your first and last meals of the day 09/02/18   Nyoka CowdenWert, Michael B, MD  traMADol (ULTRAM) 50 MG tablet Take 1 tablet (50 mg total) by mouth every 12 (twelve) hours as needed. 10/27/18   Janace ArisBast, Sadik Piascik A, NP    Family History Family History  Problem Relation Age of Onset  . Diabetes Mother   . COPD Mother   . CAD Father   . Throat cancer Father     Social History Social History   Tobacco Use  . Smoking status: Current Every Day Smoker    Packs/day: 1.50    Years: 26.00    Pack years: 39.00    Types: Cigarettes  . Smokeless tobacco: Never Used  Substance Use Topics  . Alcohol use: Yes  . Drug use: Never     Allergies   Patient has no known allergies.   Review of Systems Review of Systems   Physical Exam Triage Vital Signs ED Triage Vitals  Enc Vitals Group     BP 10/27/18 1933  127/66     Pulse Rate 10/27/18 1933 66     Resp 10/27/18 1933 18     Temp 10/27/18 1933 99.5 F (37.5 C)     Temp Source 10/27/18 1933 Oral     SpO2 10/27/18 1933 100 %     Weight 10/27/18 1931 130 lb (59 kg)     Height --      Head Circumference --      Peak Flow --      Pain Score 10/27/18 1931 8     Pain Loc --      Pain Edu? --      Excl. in GC? --    No data found.  Updated Vital Signs BP 127/66 (BP Location: Left Arm)   Pulse 66   Temp 99.5 F (37.5 C) (Oral)   Resp 18   Wt 130 lb (59 kg)   SpO2 100%   BMI 19.20 kg/m   Visual Acuity Right Eye Distance:   Left Eye Distance:   Bilateral Distance:    Right Eye Near:   Left Eye Near:    Bilateral Near:     Physical Exam  Constitutional: He appears well-developed and well-nourished.  HENT:  Head:  Normocephalic.  Eyes: Conjunctivae are normal.  Neck: Normal range of motion.  Pulmonary/Chest: Effort normal.  Musculoskeletal: Normal range of motion. He exhibits tenderness.  Good range of motion to spine. Mild tenderness to right thoracic paravertebral musculature and lower lumbar paravertebral musculature. Negative straight leg raise. No swelling, bruising or deformities to the back.  Neurological: He is alert.  Skin: Skin is warm and dry.  Psychiatric: He has a normal mood and affect.  Nursing note and vitals reviewed.    UC Treatments / Results  Labs (all labs ordered are listed, but only abnormal results are displayed) Labs Reviewed - No data to display  EKG None  Radiology No results found.  Procedures Procedures (including critical care time)  Medications Ordered in UC Medications  ketorolac (TORADOL) 30 MG/ML injection 30 mg (30 mg Intramuscular Given 10/27/18 2000)    Initial Impression / Assessment and Plan / UC Course  I have reviewed the triage vital signs and the nursing notes.  Pertinent labs & imaging results that were available during my care of the patient were reviewed by me and considered in my medical decision making (see chart for details).     Patient having mild radicular symptoms. Negative straight leg raise We will give Toradol injection here in clinic for pain inflammation Told to continue the muscle relaxants as needed Tramadol given for more severe back pain Work note given Instructed to follow-up with his primary care provider for worsening or continued symptoms. Final Clinical Impressions(s) / UC Diagnoses   Final diagnoses:  Chronic right-sided low back pain with right-sided sciatica     Discharge Instructions     We will give you a Toradol injection here for your back pain. You can continue the muscle relaxers that you have at home as needed I will give you a few tramadol to help with more severe pain Please follow-up with  your doctor for further management     ED Prescriptions    Medication Sig Dispense Auth. Provider   traMADol (ULTRAM) 50 MG tablet Take 1 tablet (50 mg total) by mouth every 12 (twelve) hours as needed. 6 tablet Dahlia Byes A, NP     Controlled Substance Prescriptions [place] Controlled Substance Registry consulted? Yes, I have consulted the  [place] Controlled Substances Registry for this patient, and feel the risk/benefit ratio today is favorable for proceeding with this prescription for a controlled substance.   [name], NP 10/28/18 1027

## 2018-11-14 MED FILL — METHOCARBAMOL 500 MG TABS: 500 | 30 days supply | Qty: 30 | Fill #1

## 2018-11-14 MED FILL — IBUPROFEN 600 MG TABLET: 600 | 20 days supply | Qty: 60 | Fill #1

## 2018-11-20 ENCOUNTER — Ambulatory Visit: Payer: Self-pay | Attending: Family Medicine | Admitting: Family Medicine

## 2018-11-20 ENCOUNTER — Encounter: Payer: Self-pay | Admitting: Family Medicine

## 2018-11-20 VITALS — BP 147/81 | HR 71 | Temp 98.5°F | Resp 20 | Wt 138.0 lb

## 2018-11-20 DIAGNOSIS — Z8249 Family history of ischemic heart disease and other diseases of the circulatory system: Secondary | ICD-10-CM | POA: Insufficient documentation

## 2018-11-20 DIAGNOSIS — R030 Elevated blood-pressure reading, without diagnosis of hypertension: Secondary | ICD-10-CM

## 2018-11-20 DIAGNOSIS — M549 Dorsalgia, unspecified: Secondary | ICD-10-CM | POA: Insufficient documentation

## 2018-11-20 DIAGNOSIS — M62838 Other muscle spasm: Secondary | ICD-10-CM | POA: Insufficient documentation

## 2018-11-20 DIAGNOSIS — J069 Acute upper respiratory infection, unspecified: Secondary | ICD-10-CM

## 2018-11-20 DIAGNOSIS — Z720 Tobacco use: Secondary | ICD-10-CM

## 2018-11-20 DIAGNOSIS — R42 Dizziness and giddiness: Secondary | ICD-10-CM | POA: Insufficient documentation

## 2018-11-20 DIAGNOSIS — F17210 Nicotine dependence, cigarettes, uncomplicated: Secondary | ICD-10-CM

## 2018-11-20 DIAGNOSIS — J441 Chronic obstructive pulmonary disease with (acute) exacerbation: Secondary | ICD-10-CM

## 2018-11-20 MED ORDER — DOXYCYCLINE HYCLATE 100 MG PO TABS: 100.0000 mg | ORAL_TABLET | Freq: Two times a day (BID) | ORAL | 0 refills | Status: DC

## 2018-11-20 MED ORDER — PREDNISONE 20 MG PO TABS: ORAL_TABLET | ORAL | 0 refills | Status: DC

## 2018-11-20 MED FILL — DOXYCYCLINE HYCLATE 100 MG: 100 | 10 days supply | Qty: 20 | Fill #0

## 2018-11-20 MED FILL — predniSONE 20 MG TABS: 20 | 5 days supply | Qty: 10 | Fill #0

## 2018-11-20 NOTE — Progress Notes (Signed)
Subjective:    Patient ID: Douglas Guzman, male    DOB: 1981-04-26, 37 y.o.   MRN: [id number]  HPI      37 yo male who presented to the office due to an elevated blood pressure reading which was obtained at patient's workplace on 11/14/2018 of 144/102 at 5:25 pm.  Patient walked into the office asking to be seen as he cannot return to work until he is cleared by his doctor to return due to his recent elevated blood pressure.  Patient states that he was recently at his job and had onset of some central chest pain.  Patient states that at the time of onset of chest pain, he has also been having some issues with a productive cough with white sputum and wheezing and he believed that the chest pain was secondary to his cough.  Patient states that he was told that because his blood pressure was elevated at 144/102 that he would have to be cleared to return to work.  Patient states that he was unable to get an appointment here due to the Christmas holiday and therefore he walked in today to see if he can return to work.      Patient states that he has had some issues with sensation of chills for a few days last week as well as continued productive cough.  Patient states that his workplace is cold and he thought that perhaps he was just cold because of being at work.  Patient did have some central chest pain/discomfort with coughing this weekend.  Patient at today's visit has a generalized headache which is dull.  Patient has had some dizziness over the past week.  Patient denies dizziness at today's visit.  Patient does feel fatigued.  Patient denies any use of decongestant type medication.  Patient states that he is only been taking his prescribed medications to help with his breathing as well as ibuprofen for his back pain and after work, patient takes tramadol and baclofen to help with back pain and muscle spasm.  Patient states that he continues to smoke but wishes that he could stop.  Patient has felt that he  has had wheezing off and on for about 5 days. Past Medical History:  Diagnosis Date  . COPD (chronic obstructive pulmonary disease) (HCC)   . Tobacco dependence    Past Surgical History:  Procedure Laterality Date  . BRAIN SURGERY    . INGUINAL HERNIA REPAIR     occurred in childhood   Family History  Problem Relation Age of Onset  . Diabetes Mother   . COPD Mother   . CAD Father   . Throat cancer Father    Social History   Tobacco Use  . Smoking status: Current Every Day Smoker    Packs/day: 1.50    Years: 26.00    Pack years: 39.00    Types: Cigarettes  . Smokeless tobacco: Never Used  Substance Use Topics  . Alcohol use: Yes  . Drug use: Yes    Types: Marijuana  No Known Allergies  Review of Systems  Constitutional: Positive for chills and fatigue. Negative for fever.  HENT: Positive for congestion, postnasal drip and rhinorrhea. Negative for ear pain, sinus pressure, sinus pain, sore throat and trouble swallowing.   Respiratory: Positive for cough, chest tightness, shortness of breath and wheezing.   Cardiovascular: Positive for chest pain. Negative for palpitations and leg swelling.  Gastrointestinal: Negative for abdominal pain and nausea.  Genitourinary: Negative  for dysuria and frequency.  Musculoskeletal: Positive for arthralgias and back pain.  Neurological: Positive for dizziness and headaches.  Hematological: Negative for adenopathy. Does not bruise/bleed easily.       Objective:   Physical Exam BP (!) 147/81 (BP Location: Left Arm, Patient Position: Sitting, Cuff Size: Normal)   Pulse 71   Temp 98.5 F (36.9 C)   Resp 20   Wt 138 lb (62.6 kg)   SpO2 97%   BMI 20.38 kg/m  nurses notes and vital signs reviewed Gen- Thin framed male who appears older than his stated age who is sitting on the exam table- patient appears not to feel well; patient with occasional sniffling and has a nasal quality to his voice ENT-TMs dull, patient with edema/mild  erythema of the nasal mucosa and patient with clear to white nasal discharge, patient with posterior pharynx erythema Neck-supple, no lymphadenopathy Lungs-patient with decreased breath sounds, decreased air movement but no active wheezing.  Patient with no increased work of breathing Cardiovascular-regular rate and rhythm Abdomen-soft, nontender Extremities-no edema       Assessment & Plan:  1. COPD with exacerbation [facility]) Patient with complaint of cough that is productive of white sputum as well as chest pain with coughing and wheezing.  Patient is being placed on doxycycline and prednisone for COPD exacerbation and patient has been asked to continue use of his inhalers.  Patient was also made aware that he needs to stop smoking and when patient is over his illness, patient will likely be scheduled for follow-up with the clinical pharmacist or with a physician here in this office was also pulmonologist to follow-up on COPD/smoking cessation. - doxycycline (VIBRA-TABS) 100 MG tablet; Take 1 tablet (100 mg total) by mouth 2 (two) times daily.  Dispense: 20 tablet; Refill: 0 - predniSONE (DELTASONE) 20 MG tablet; Take 2 pills once per day for 5 days; take after eating  Dispense: 10 tablet; Refill: 0  2. URI with cough and congestion Patient has been asked to avoid the use of decongestants which can further raise his blood pressure but patient may take over-the-counter antihistamines as well as medication such as Mucinex to help with chest congestion and or Robitussin-DM to help with chest congestion and cough.  3. Elevated blood pressure reading Patient had recent elevated blood pressure reading at his workplace and again at today's visit.  Patient will be treated for COPD exacerbation.  Patient is encouraged to avoid the use of decongestants and patient should also rest and remain well-hydrated overnight.  Patient had requested to return to work tonight but as patient appears to not feel well and  his blood pressure was still elevated, patient has been asked to return tomorrow to have blood pressure rechecked by clinical pharmacist and if blood pressure is at 140/90 or less, and if patient is feeling better he can return to work otherwise he may need to be out of work until Monday and have blood pressure rechecked on Monday.  4. Tobacco use When patient is over his current acute illness, patient is aware that he needs to completely stop smoking and patient will have visit with clinical pharmacist, myself or pulmonologist that works at this clinic to discuss strategies to help patient with a smoking cessation/COPD  An After Visit Summary was printed and given to the patient. Allergies as of 11/20/2018   No Known Allergies     Medication List       Accurate as of November 20, 2018 12:31 PM.  Always use your most recent med list.        budesonide-formoterol 160-4.5 MCG/ACT inhaler Commonly known as:  SYMBICORT Inhale 2 puffs into the lungs 2 (two) times daily.   budesonide-formoterol 160-4.5 MCG/ACT inhaler Commonly known as:  SYMBICORT Inhale 2 puffs into the lungs 2 (two) times daily.   doxycycline 100 MG tablet Commonly known as:  VIBRA-TABS Take 1 tablet (100 mg total) by mouth 2 (two) times daily.   ibuprofen 600 MG tablet Commonly known as:  ADVIL,MOTRIN Take 1 tablet (600 mg total) by mouth every 8 (eight) hours as needed. Take after eating   methocarbamol 500 MG tablet Commonly known as:  ROBAXIN Take 1 tablet (500 mg total) by mouth at bedtime as needed for muscle spasms.   omeprazole 40 MG capsule Commonly known as:  PRILOSEC Take 30- 60 min before your first and last meals of the day   predniSONE 20 MG tablet Commonly known as:  DELTASONE Take 2 pills once per day for 5 days; take after eating   traMADol 50 MG tablet Commonly known as:  ULTRAM Take 1 tablet (50 mg total) by mouth every 12 (twelve) hours as needed.      [facility]  [facility] reche[facility].

## 2018-11-20 NOTE — Progress Notes (Signed)
BP was elevated at work- 144/102  Instructed to see MD today for clearance to return.   Headache presently  Intermittent dizziness

## 2018-11-21 ENCOUNTER — Encounter: Payer: Self-pay | Admitting: Pharmacist

## 2018-11-21 ENCOUNTER — Telehealth: Payer: Self-pay | Admitting: Family Medicine

## 2018-11-21 ENCOUNTER — Ambulatory Visit: Payer: Self-pay | Attending: Family Medicine | Admitting: Pharmacist

## 2018-11-21 VITALS — BP 138/68 | HR 62

## 2018-11-21 DIAGNOSIS — Z833 Family history of diabetes mellitus: Secondary | ICD-10-CM | POA: Insufficient documentation

## 2018-11-21 DIAGNOSIS — F17200 Nicotine dependence, unspecified, uncomplicated: Secondary | ICD-10-CM | POA: Insufficient documentation

## 2018-11-21 DIAGNOSIS — J441 Chronic obstructive pulmonary disease with (acute) exacerbation: Secondary | ICD-10-CM | POA: Insufficient documentation

## 2018-11-21 DIAGNOSIS — Z836 Family history of other diseases of the respiratory system: Secondary | ICD-10-CM | POA: Insufficient documentation

## 2018-11-21 DIAGNOSIS — R030 Elevated blood-pressure reading, without diagnosis of hypertension: Secondary | ICD-10-CM | POA: Insufficient documentation

## 2018-11-21 DIAGNOSIS — Z8249 Family history of ischemic heart disease and other diseases of the circulatory system: Secondary | ICD-10-CM | POA: Insufficient documentation

## 2018-11-21 NOTE — Telephone Encounter (Addendum)
Patient's employer [employer] nurse called to verify dates on how long patient is to stay out of work. Nurse was informed that time of leave information was unavailable.   Patient's next appointment is scheduled for 01/07/2019.   And the note that was written by PCP says that patient is to return to work pending the visit on Friday. After reviewing the AVS patient was to schedule an appointment today 12/27 with luke however, no appointment was scheduled.  Please follow up with clarification on dates

## 2018-11-21 NOTE — Patient Instructions (Signed)
Thank you for coming to see us today.   Blood pressure today is coming back down.  Please continue your prednisone and doxycycline and remain out of work until Monday.    If you have any questions about medications, please call me [phone].  Franky MachoLuke

## 2018-11-21 NOTE — Telephone Encounter (Signed)
Attempt made again to call patient. Left message with male to return call.   Please advise patient, reminding him to return to office for BP check and note to return to work.

## 2018-11-21 NOTE — Progress Notes (Signed)
   S:    Patient arrives for a BP check per request of Dr. Jillyn HiddenFulp. He has not been diagnosed with HTN nor does he take any anti-hypertensive. He was seen here yesterday for COPD exacerbation and has started prednisone and doxycycline.   Today, he denies chest pain. Does endorse shortness of breath that has not worsened since yesterday. Does report some dizziness currently. Has taken 600 mg of ibuprofen this morning ~9 AM. Denies taking any decongestants or other OTC medications. He reports smoking a cigarette ~ 10 minutes before seeing me today.   Family / Social history: CAD (father), DM & COPD (mother); current smoker (1.5 PPD); reports drinking alcohol but does not give amount  Home BP readings: does not check BP at home  O:  L arm after 5 minutes rest: 138/68, HR 62 Last 3 Office BP readings: BP Readings from Last 3 Encounters:  11/21/18 138/68  11/20/18 (!) 147/81  10/27/18 127/66   BMET    Component Value Date/Time   NA 137 08/06/2018 1159   K 5.2 08/06/2018 1159   CL 101 08/06/2018 1159   CO2 24 08/06/2018 1159   GLUCOSE 95 08/06/2018 1159   GLUCOSE 70 06/05/2018 1349   BUN 10 08/06/2018 1159   CREATININE 1.06 08/06/2018 1159   CALCIUM 8.9 08/06/2018 1159   GFRNONAA 89 08/06/2018 1159   GFRAA 102 08/06/2018 1159    Renal function: CrCl cannot be calculated (Patient's most recent lab result is older than the maximum 21 days allowed.).  Clinical ASCVD: No  The ASCVD Risk score Denman George(Goff DC Jr., et al., 2013) failed to calculate for the following reasons:   The 2013 ASCVD risk score is only valid for ages 3040 to 4479  A/P: Hypertension UNdiagnosed. Per PCP, pt can return to work if he feels better and BP is <140/90. His BP is 138/68, however, he continues to be short of breath and reports not feeling much better. I discussed these findings with Dr. Jillyn HiddenFulp. Per her recommendation, will have patient hold out of work until  BP re-check Monday.   -Counseled on lifestyle modifications  for blood pressure control including reduced dietary sodium, increased exercise, adequate sleep - Emphasized compliance with doxycycline and prednisone given at yesterday's visit.   Results reviewed and written information provided. Total time in face-to-face counseling 15 minutes.   F/U Clinic Visit for BP check Monday.    Butch PennyLuke Van Ausdall, PharmD, CPP Clinical Pharmacist [facility] [phone]

## 2018-11-21 NOTE — Telephone Encounter (Signed)
Patient called back regarding missed call. Patient was informed of message and was schedule for his BP check at 1:30 with luke.

## 2018-11-21 NOTE — Telephone Encounter (Signed)
Attempt to call patient to remind him of need to check BP today per [facility]. A letter was written for patient provide to employer.  No answer.

## 2018-11-24 ENCOUNTER — Ambulatory Visit: Payer: Self-pay | Attending: Family Medicine | Admitting: Pharmacist

## 2018-11-24 ENCOUNTER — Encounter: Payer: Self-pay | Admitting: Pharmacist

## 2018-11-24 VITALS — BP 133/64 | HR 77

## 2018-11-24 DIAGNOSIS — Z833 Family history of diabetes mellitus: Secondary | ICD-10-CM | POA: Insufficient documentation

## 2018-11-24 DIAGNOSIS — Z8249 Family history of ischemic heart disease and other diseases of the circulatory system: Secondary | ICD-10-CM | POA: Insufficient documentation

## 2018-11-24 DIAGNOSIS — Z0131 Encounter for examination of blood pressure with abnormal findings: Secondary | ICD-10-CM | POA: Insufficient documentation

## 2018-11-24 DIAGNOSIS — R030 Elevated blood-pressure reading, without diagnosis of hypertension: Secondary | ICD-10-CM

## 2018-11-24 NOTE — Progress Notes (Signed)
   S:    Patient arrives for a BP check per request of Dr. Jillyn HiddenFulp. He has not been diagnosed with HTN nor does he take any anti-hypertensive. He was seen here 11/20/18 for COPD exacerbation.    Today, he denies chest pain. Does endorse shortness of breath that has improved since last Thursday. He reports feeling better overall and endorses compliance with his doxycycline and prednisone.   Family / Social history: CAD (father), DM & COPD (mother); current smoker (1.5 PPD); reports drinking alcohol but does not give amount  Home BP readings: does not check BP at home  O:  L arm after 5 minutes rest: 138/68, HR 62  Last 3 Office BP readings: BP Readings from Last 3 Encounters:  11/21/18 138/68  11/20/18 (!) 147/81  10/27/18 127/66   BMET    Component Value Date/Time   NA 137 08/06/2018 1159   K 5.2 08/06/2018 1159   CL 101 08/06/2018 1159   CO2 24 08/06/2018 1159   GLUCOSE 95 08/06/2018 1159   GLUCOSE 70 06/05/2018 1349   BUN 10 08/06/2018 1159   CREATININE 1.06 08/06/2018 1159   CALCIUM 8.9 08/06/2018 1159   GFRNONAA 89 08/06/2018 1159   GFRAA 102 08/06/2018 1159    Renal function: CrCl cannot be calculated (Patient's most recent lab result is older than the maximum 21 days allowed.).  Clinical ASCVD: No  The ASCVD Risk score Denman George(Goff DC Jr., et al., 2013) failed to calculate for the following reasons:   The 2013 ASCVD risk score is only valid for ages 7740 to 3679  A/P: Hypertension UNdiagnosed. Per PCP, pt can return to work with improvement in symptoms and in BP. Both have improved today.. I discussed these findings with Dr. Jillyn HiddenFulp. Per her recommendation, will have patient return to work.  -Counseled on lifestyle modifications for blood pressure control including reduced dietary sodium, increased exercise, adequate sleep - Emphasized compliance with doxycycline and prednisone given at yesterday's visit.   Results reviewed and written information provided. Total time in  face-to-face counseling 15 minutes.   F/U with PCP.   Butch PennyLuke Van Ausdall, PharmD, CPP Clinical Pharmacist [facility] [phone]

## 2018-12-11 MED FILL — IBUPROFEN 600 MG TABLET: 600 | 20 days supply | Qty: 60 | Fill #2

## 2018-12-22 ENCOUNTER — Other Ambulatory Visit: Payer: Self-pay

## 2018-12-22 ENCOUNTER — Emergency Department (HOSPITAL_COMMUNITY)
Admission: EM | Admit: 2018-12-22 | Discharge: 2018-12-22 | Disposition: A | Discharge status: Home / Self Care | Payer: Medicaid Other | Attending: Emergency Medicine | Admitting: Emergency Medicine

## 2018-12-22 ENCOUNTER — Encounter (HOSPITAL_COMMUNITY): Payer: Self-pay | Admitting: Emergency Medicine

## 2018-12-22 DIAGNOSIS — M546 Pain in thoracic spine: Secondary | ICD-10-CM | POA: Insufficient documentation

## 2018-12-22 DIAGNOSIS — Z79899 Other long term (current) drug therapy: Secondary | ICD-10-CM | POA: Insufficient documentation

## 2018-12-22 DIAGNOSIS — F17210 Nicotine dependence, cigarettes, uncomplicated: Secondary | ICD-10-CM | POA: Insufficient documentation

## 2018-12-22 DIAGNOSIS — J449 Chronic obstructive pulmonary disease, unspecified: Secondary | ICD-10-CM | POA: Insufficient documentation

## 2018-12-22 MED ORDER — DICLOFENAC SODIUM 1 % TD GEL: 4.0000 g | Freq: Four times a day (QID) | TRANSDERMAL | 0 refills | Status: DC

## 2018-12-22 MED ORDER — DIAZEPAM 5 MG PO TABS
5.0000 mg | ORAL_TABLET | Freq: Once | ORAL | Status: AC
  Administered 2018-12-22: 5 mg via ORAL
  Filled 2018-12-22: qty 1

## 2018-12-22 MED ORDER — KETOROLAC TROMETHAMINE 60 MG/2ML IM SOLN
15.0000 mg | Freq: Once | INTRAMUSCULAR | Status: AC
  Administered 2018-12-22: 15 mg via INTRAMUSCULAR
  Filled 2018-12-22: qty 2

## 2018-12-22 MED ORDER — OXYCODONE HCL 5 MG PO TABS
5.0000 mg | ORAL_TABLET | Freq: Once | ORAL | Status: AC
  Administered 2018-12-22: 5 mg via ORAL
  Filled 2018-12-22: qty 1

## 2018-12-22 MED ORDER — ACETAMINOPHEN 500 MG PO TABS
1000.0000 mg | ORAL_TABLET | Freq: Once | ORAL | Status: AC
  Administered 2018-12-22: 1000 mg via ORAL
  Filled 2018-12-22: qty 2

## 2018-12-22 MED FILL — DICLOFENAC SODIUM 1% GEL: 1 | 25 days supply | Qty: 100 | Fill #0

## 2018-12-22 NOTE — Discharge Instructions (Signed)
Take tylenol 1000mg(2 extra strength) four times a day.  ° °

## 2018-12-22 NOTE — ED Triage Notes (Signed)
bacvk pain x 3 months saw a dr and given meds haS FOLLOW UP IN FEB BUT HURTS TO BAD

## 2018-12-22 NOTE — ED Provider Notes (Signed)
[name] EMERGENCY DEPARTMENT Provider Note   CSN: [id number] Arrival date & time: 12/22/18  1151     History   Chief Complaint Chief Complaint  Patient presents with  . Back Pain    HPI Douglas Guzman is a 38 y.o. male.  38 yo M with a chief complaint of thoracic back pain.  The patient works in a job where he has to do repetitive motions above his head.  He has had this pain for about 3 months.  Slowly worsening over the past few days.  Denies numbness or tingling denies weakness.  Denies loss of bowel or bladder.  Denies trauma.  Denies fevers or chills denies IV drug abuse.  The history is provided by the patient.  Back Pain  Location:  Thoracic spine Quality:  Stabbing Radiates to:  Does not radiate Pain severity:  Moderate Pain is:  Same all the time Onset quality:  Gradual Duration:  2 days Timing:  Constant Progression:  Worsening Chronicity:  New Relieved by:  Nothing Worsened by:  Nothing Ineffective treatments:  None tried Associated symptoms: no abdominal pain, no chest pain, no fever and no headaches     Past Medical History:  Diagnosis Date  . COPD (chronic obstructive pulmonary disease) (HCC)   . Tobacco dependence     Patient Active Problem List   Diagnosis Date Noted  . DOE (dyspnea on exertion) 09/02/2018  . COPD GOLD II/ still smoking  09/02/2018  . Cigarette smoker 09/02/2018    Past Surgical History:  Procedure Laterality Date  . BRAIN SURGERY    . INGUINAL HERNIA REPAIR     occurred in childhood        Home Medications    Prior to Admission medications   Medication Sig Start Date End Date Taking? Authorizing Provider  budesonide-formoterol (SYMBICORT) 160-4.5 MCG/ACT inhaler Inhale 2 puffs into the lungs 2 (two) times daily. 09/02/18   Douglas Cowden, Guzman  diclofenac sodium (VOLTAREN) 1 % GEL Apply 4 g topically 4 (four) times daily. 12/22/18   Melene Plan, DO  doxycycline (VIBRA-TABS) 100 MG tablet Take 1  tablet (100 mg total) by mouth 2 (two) times daily. 11/20/18   Douglas Guzman  ibuprofen (ADVIL,MOTRIN) 600 MG tablet Take 1 tablet (600 mg total) by mouth every 8 (eight) hours as needed. Take after eating 10/09/18   Douglas Guzman  methocarbamol (ROBAXIN) 500 MG tablet Take 1 tablet (500 mg total) by mouth at bedtime as needed for muscle spasms. 10/09/18   Douglas Guzman  omeprazole (PRILOSEC) 40 MG capsule Take 30- 60 min before your first and last meals of the day Patient not taking: Reported on 11/21/2018 09/02/18   Douglas Cowden, Guzman  predniSONE (DELTASONE) 20 MG tablet Take 2 pills once per day for 5 days; take after eating 11/20/18   Douglas Guzman  traMADol (ULTRAM) 50 MG tablet Take 1 tablet (50 mg total) by mouth every 12 (twelve) hours as needed. Patient not taking: Reported on 11/20/2018 10/27/18   Douglas Guzman    Family History Family History  Problem Relation Age of Onset  . Diabetes Mother   . COPD Mother   . CAD Father   . Throat cancer Father     Social History Social History   Tobacco Use  . Smoking status: Current Every Day Smoker    Packs/day: 1.50    Years: 26.00    Pack years: 39.00  Types: Cigarettes  . Smokeless tobacco: Never Used  Substance Use Topics  . Alcohol use: Yes  . Drug use: Yes    Types: Marijuana     Allergies   Patient has no known allergies.   Review of Systems Review of Systems  Constitutional: Negative for chills and fever.  HENT: Negative for congestion and facial swelling.   Eyes: Negative for discharge and visual disturbance.  Respiratory: Negative for shortness of breath.   Cardiovascular: Negative for chest pain and palpitations.  Gastrointestinal: Negative for abdominal pain, diarrhea and vomiting.  Musculoskeletal: Positive for back pain. Negative for arthralgias and myalgias.  Skin: Negative for color change and rash.  Neurological: Negative for tremors, syncope and headaches.  Psychiatric/Behavioral:  Negative for confusion and dysphoric mood.     Physical Exam Updated Vital Signs BP 126/82 (BP Location: Right Arm)   Pulse (!) 57   Temp 98.2 F (36.8 C) (Oral)   Resp 16   SpO2 100%   Physical Exam Vitals signs and nursing note reviewed.  Constitutional:      Appearance: He is well-developed.  HENT:     Head: Normocephalic and atraumatic.  Eyes:     Pupils: Pupils are equal, round, and reactive to light.  Neck:     Musculoskeletal: Normal range of motion and neck supple.     Vascular: No JVD.  Cardiovascular:     Rate and Rhythm: Normal rate and regular rhythm.     Heart sounds: No murmur. No friction rub. No gallop.   Pulmonary:     Effort: No respiratory distress.     Breath sounds: No wheezing.  Abdominal:     General: There is no distension.     Tenderness: There is no guarding or rebound.  Musculoskeletal: Normal range of motion.  Skin:    Coloration: Skin is not pale.     Findings: No rash.  Neurological:     Mental Status: He is alert and oriented to person, place, and time.  Psychiatric:        Behavior: Behavior normal.      ED Treatments / Results  Labs (all labs ordered are listed, but only abnormal results are displayed) Labs Reviewed - No data to display  EKG None  Radiology No results found.  Procedures Procedures (including critical care time)  Medications Ordered in ED Medications  ketorolac (TORADOL) injection 15 mg (has no administration in time range)  acetaminophen (TYLENOL) tablet 1,000 mg (has no administration in time range)  oxyCODONE (Oxy IR/ROXICODONE) immediate release tablet 5 mg (has no administration in time range)  diazepam (VALIUM) tablet 5 mg (has no administration in time range)     Initial Impression / Assessment and Plan / ED Course  I have reviewed the triage vital signs and the nursing notes.  Pertinent labs & imaging results that were available during my care of the patient were reviewed by me and  considered in my medical decision making (see chart for details).     38 yo M with a chief complaint of thoracic back pain.  He has no red flags.  No neurologic deficits.  Will treat supportively.  With the patient having 3 months of symptoms and constant ibuprofen use will prescribe Voltaren gel.  12:46 PM:  I have discussed the diagnosis/risks/treatment options with the patient and believe the pt to be eligible for discharge home to follow-up with PCP. We also discussed returning to the ED immediately if new or worsening sx occur.  We discussed the sx which are most concerning (e.g., sudden worsening pain, fever, inability to tolerate by mouth) that necessitate immediate return. Medications administered to the patient during their visit and any new prescriptions provided to the patient are listed below.  Medications given during this visit Medications  ketorolac (TORADOL) injection 15 mg (has no administration in time range)  acetaminophen (TYLENOL) tablet 1,000 mg (has no administration in time range)  oxyCODONE (Oxy IR/ROXICODONE) immediate release tablet 5 mg (has no administration in time range)  diazepam (VALIUM) tablet 5 mg (has no administration in time range)     The patient appears reasonably screen and/or stabilized for discharge and I doubt any other medical condition or other [facility] requiring further screening, evaluation, or treatment in the ED at this time prior to discharge.    Final Clinical Impressions(s) / ED Diagnoses   Final diagnoses:  Acute left-sided thoracic back pain    ED Discharge Orders         Ordered    diclofenac sodium (VOLTAREN) 1 % GEL  4 times daily     12/22/18 1232           Melene PlanFloyd, Areya Lemmerman, DO 12/22/18 1246

## 2018-12-24 ENCOUNTER — Encounter (HOSPITAL_COMMUNITY): Payer: Self-pay | Admitting: Emergency Medicine

## 2018-12-24 ENCOUNTER — Other Ambulatory Visit: Payer: Self-pay

## 2018-12-24 ENCOUNTER — Emergency Department (HOSPITAL_COMMUNITY)
Admission: EM | Admit: 2018-12-24 | Discharge: 2018-12-24 | Disposition: A | Discharge status: Home / Self Care | Payer: Medicaid Other | Attending: Emergency Medicine | Admitting: Emergency Medicine

## 2018-12-24 DIAGNOSIS — R0981 Nasal congestion: Secondary | ICD-10-CM | POA: Insufficient documentation

## 2018-12-24 DIAGNOSIS — Z5321 Procedure and treatment not carried out due to patient leaving prior to being seen by health care provider: Secondary | ICD-10-CM | POA: Insufficient documentation

## 2018-12-24 DIAGNOSIS — R05 Cough: Secondary | ICD-10-CM | POA: Insufficient documentation

## 2018-12-24 DIAGNOSIS — R509 Fever, unspecified: Secondary | ICD-10-CM | POA: Insufficient documentation

## 2018-12-24 DIAGNOSIS — M545 Low back pain: Secondary | ICD-10-CM | POA: Insufficient documentation

## 2018-12-24 MED ORDER — ACETAMINOPHEN 325 MG PO TABS
650.0000 mg | ORAL_TABLET | Freq: Once | ORAL | Status: AC | PRN
  Administered 2018-12-24: 650 mg via ORAL
  Filled 2018-12-24: qty 2

## 2018-12-24 NOTE — ED Triage Notes (Signed)
C/o lower back pain x 1-2 weeks.  Seen in ED for same 2 days ago.  Now reports fever, nasal congestion, and non-productive cough since last night.

## 2018-12-24 NOTE — ED Notes (Signed)
Pt not in room.

## 2019-01-09 ENCOUNTER — Ambulatory Visit: Payer: Medicaid Other | Admitting: Family Medicine

## 2019-01-26 ENCOUNTER — Encounter: Payer: Self-pay | Admitting: Family Medicine

## 2019-01-26 ENCOUNTER — Ambulatory Visit: Payer: Self-pay | Attending: Family Medicine | Admitting: Family Medicine

## 2019-01-26 VITALS — BP 113/76 | HR 72 | Temp 98.2°F | Resp 18 | Ht 69.0 in | Wt 143.0 lb

## 2019-01-26 DIAGNOSIS — M546 Pain in thoracic spine: Secondary | ICD-10-CM

## 2019-01-26 DIAGNOSIS — M25511 Pain in right shoulder: Secondary | ICD-10-CM

## 2019-01-26 DIAGNOSIS — G8929 Other chronic pain: Secondary | ICD-10-CM

## 2019-01-26 DIAGNOSIS — J441 Chronic obstructive pulmonary disease with (acute) exacerbation: Secondary | ICD-10-CM

## 2019-01-26 DIAGNOSIS — M25512 Pain in left shoulder: Secondary | ICD-10-CM

## 2019-01-26 MED ORDER — DOXYCYCLINE HYCLATE 100 MG PO TABS: 100.0000 mg | ORAL_TABLET | Freq: Two times a day (BID) | ORAL | 0 refills | Status: DC

## 2019-01-26 MED ORDER — TRAMADOL HCL 50 MG PO TABS: 50.0000 mg | ORAL_TABLET | Freq: Two times a day (BID) | ORAL | 2 refills | Status: DC | PRN

## 2019-01-26 MED ORDER — ALBUTEROL SULFATE HFA 108 (90 BASE) MCG/ACT IN AERS: 2.0000 | INHALATION_SPRAY | Freq: Four times a day (QID) | RESPIRATORY_TRACT | 2 refills | Status: DC | PRN

## 2019-01-26 MED ORDER — PREDNISONE 20 MG PO TABS: ORAL_TABLET | ORAL | 0 refills | Status: DC

## 2019-01-26 NOTE — Progress Notes (Signed)
Established Patient Office Visit  Subjective:  Patient ID: Douglas Guzman, male    DOB: Apr 07, 1981  Age: 38 y.o. MRN: [id number]  CC: Back and shoulder pain, productive cough Chief Complaint  Patient presents with  . Follow-up    HPI Douglas Guzman presents for treatment of continued issues with pain in his mid and upper back as well as bilateral shoulder pain and patient reports continued cough that is productive of white to yellow or green sputum with some shortness of breath.  Patient reports daily pain in his mid back and shoulders.  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  Patient has sharp pain in his mid back as well as upper back midline.  Patient also with recurrent low back pain.  Patient states that he has burning sensation in the shoulders that is worse with moving/lifting his arms.  Patient does not feel as if he is having any radiation of pain past the shoulders down his arms.  He reports that he has been seen at the emergency department since his last visit here due to the pain in his mid back.  Patient takes over-the-counter ibuprofen for pain.  Patient denies any blood in the stool and no abdominal pain/nausea.      Patient reports that he continues to smoke but would really like to stop smoking.  Patient continues to have productive cough with some shortness of breath.  Patient has his medication bottles with him.  Patient did finish the prednisone taper but did not finish his doxycycline.  Patient denies fever chills but patient does have fatigue.  Patient still smokes about 1-1/2 packs of cigarettes daily.  Patient denies any recent fever or chills.  Patient has some nasal congestion.  Patient denies any sore throat.  Patient reports that he does use his Symbicort inhaler but left it at home.  Patient reports that he feels as if he cannot keep down weight.  Patient  denies any decreased appetite but feels that even though he eats that his weight does not really increase. (On chart review, patient has actually gained 13 pounds since early December 2019).  Past Medical History:  Diagnosis Date  . COPD (chronic obstructive pulmonary disease) (HCC)   . Tobacco dependence     Past Surgical History:  Procedure Laterality Date  . BRAIN SURGERY    . INGUINAL HERNIA REPAIR     occurred in childhood    Family History  Problem Relation Age of Onset  . Diabetes Mother   . COPD Mother   . CAD Father   . Throat cancer Father     Social History   Tobacco Use  . Smoking status: Current Every Day Smoker    Packs/day: 1.50    Years: 26.00    Pack years: 39.00    Types: Cigarettes  . Smokeless tobacco: Never Used  Substance Use Topics  . Alcohol use: Yes  . Drug use: Yes    Types: Marijuana    Outpatient Medications Prior to Visit  Medication Sig Dispense Refill  . budesonide-formoterol (SYMBICORT) 160-4.5 MCG/ACT inhaler Inhale 2 puffs into the lungs 2 (two) times daily. 1 Inhaler 11  . diclofenac sodium (VOLTAREN) 1 % GEL Apply 4 g topically 4 (four) times daily. 100 g 0  . doxycycline (VIBRA-TABS) 100 MG tablet Take 1 tablet (100 mg total) by mouth  2 (two) times daily. 20 tablet 0  . ibuprofen (ADVIL,MOTRIN) 600 MG tablet Take 1 tablet (600 mg total) by mouth every 8 (eight) hours as needed. Take after eating 60 tablet 3  . methocarbamol (ROBAXIN) 500 MG tablet Take 1 tablet (500 mg total) by mouth at bedtime as needed for muscle spasms. 30 tablet 3  . omeprazole (PRILOSEC) 40 MG capsule Take 30- 60 min before your first and last meals of the day    . predniSONE (DELTASONE) 20 MG tablet Take 2 pills once per day for 5 days; take after eating 10 tablet 0  . traMADol (ULTRAM) 50 MG tablet Take 1 tablet (50 mg total) by mouth every 12 (twelve) hours as needed. (Patient not taking: Reported on 11/20/2018) 6 tablet 0   No facility-administered  medications prior to visit.     No Known Allergies  ROS Review of Systems  Constitutional: Positive for fatigue. Negative for chills and fever.  HENT: Positive for congestion and postnasal drip. Negative for rhinorrhea, sinus pressure, sinus pain, sore throat and trouble swallowing.   Respiratory: Positive for cough and shortness of breath.   Cardiovascular: Negative for chest pain, palpitations and leg swelling.  Gastrointestinal: Negative for abdominal pain, blood in stool, constipation, diarrhea and nausea.  Endocrine: Negative for polydipsia, polyphagia and polyuria.  Genitourinary: Negative for dysuria and frequency.  Musculoskeletal: Positive for arthralgias and back pain.  Neurological: Negative for dizziness and headaches.  Hematological: Negative for adenopathy. Does not bruise/bleed easily.      Objective:    Physical Exam  BP 113/76 (BP Location: Left Arm, Patient Position: Sitting, Cuff Size: Normal)   Pulse 72   Temp 98.2 F (36.8 C) (Oral)   Resp 18   Ht 5\' 9"  (1.753 m)   Wt 143 lb (64.9 kg)   SpO2 98%   BMI 21.12 kg/m Nurse's notes and vital signs reviewed  General- well-nourished well-developed but thin framed male in no acute distress but patient has some difficulty arising from the exam chair and appears somewhat stiff and patient has some difficulty getting onto the exam table ENT- TMs dull bilaterally, nares with mild edema/erythema of the nasal turbinates, patient with posterior pharynx/tonsillar arch edema/erythema Neck-supple, patient with some mild posterior cervical paraspinous spasm as well as upper back/trapezius spasm, no lymphadenopathy Lungs-clear to auscultation bilaterally but with decreased air movement and decreased breath sounds throughout, breathing is nonlabored Cardiovascular-regular rate and rhythm Abdomen-soft, nontender Back-no CVA tenderness, patient does have tenderness with palpation over the mid thoracic spine and lower  cervical/upper thoracic spine and patient with cervical and thoracic paraspinous spasm.  Patient with some mild lumbosacral discomfort to palpation Musculoskeletal- patient with positive empty can sign/impingement sign of the shoulders bilaterally and patient with complaint of pain with range of motion of the arms at the shoulder Psych-normal mood and judgment Wt Readings from Last 3 Encounters:  01/26/19 143 lb (64.9 kg)  11/20/18 138 lb (62.6 kg)  10/27/18 130 lb (59 kg)     Health Maintenance Due  Topic Date Due  . HIV Screening  01/15/1995    There are no preventive care reminders to display for this patient.  Lab Results  Component Value Date   TSH 1.450 01/13/2008   Lab Results  Component Value Date   WBC 11.8 (H) 08/06/2018   HGB 17.8 (H) 08/06/2018   HCT 50.6 08/06/2018   MCV 89 08/06/2018   PLT 176 08/06/2018   Lab Results  Component Value Date  NA 137 08/06/2018   K 5.2 08/06/2018   CO2 24 08/06/2018   GLUCOSE 95 08/06/2018   BUN 10 08/06/2018   CREATININE 1.06 08/06/2018   BILITOT 0.4 08/06/2018   ALKPHOS 84 08/06/2018   AST 26 08/06/2018   ALT 30 08/06/2018   PROT 6.2 08/06/2018   ALBUMIN 4.3 08/06/2018   CALCIUM 8.9 08/06/2018   ANIONGAP 9 06/05/2018   Lab Results  Component Value Date   CHOL 162 01/13/2008   Lab Results  Component Value Date   HDL 41 01/13/2008   Lab Results  Component Value Date   LDLCALC 102 (H) 01/13/2008   Lab Results  Component Value Date   TRIG 96 01/13/2008   Lab Results  Component Value Date   CHOLHDL 4.0 Ratio 01/13/2008   No results found for: HGBA1C    Assessment & Plan:  1. COPD with exacerbation [facility]) Patient with COPD exacerbation with recurrent/productive cough.  Patient did not completely complete course of doxycycline previously prescribed for symptoms.  Patient is provided with a new refill of doxycycline 100 mg twice daily x10 days as well as prednisone taper and patient is encouraged to  completely finish his antibiotics.  Patient is also given prescription for albuterol inhaler to use as needed for shortness of breath, cough or wheezing.  Patient will return to clinic to meet with clinical pharmacist in approximately 2 to 3 weeks to discuss smoking cessation - predniSONE (DELTASONE) 20 MG tablet; Take 2 pills once per day for 5 days; take after eating  Dispense: 10 tablet; Refill: 0 - doxycycline (VIBRA-TABS) 100 MG tablet; Take 1 tablet (100 mg total) by mouth 2 (two) times daily.  Dispense: 20 tablet; Refill: 0 - albuterol (PROVENTIL HFA;VENTOLIN HFA) 108 (90 Base) MCG/ACT inhaler; Inhale 2 puffs into the lungs every 6 (six) hours as needed for wheezing or shortness of breath.  Dispense: 1 Inhaler; Refill: 2  2. 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  Patient did have emergency department visit and patient's notes were reviewed.  Patient was prescribed Voltaren gel but states that he really cannot reach to the affected area to apply the gel and therefore continues to take ibuprofen.  Patient requested refill of tramadol at today's visit which was provided.  Patient is encouraged to have x-ray of the thoracic and cervical spine in a few weeks if he continues to have chronic issues with back pain.  Patient did work in roofing in the past and states that he does not recall a distinct injury to his back but has fallen off of roofs in the past and I discussed with the patient that he may likely now have degenerative arthritis or degenerative disc disease in his spine which is contributing to his chronic pain. - DG Thoracic Spine 1 View; Future - DG Cervical Spine Complete; Future - traMADol  (ULTRAM) 50 MG tablet; Take 1 tablet (50 mg total) by mouth every 12 (twelve) hours as needed.  Dispense: 30 tablet; Refill: 2  3. Chronic pain of both shoulders Patient with chronic pain of both shoulders.  I suspect that patient has rotator cuff tendinopathy.  Refill provided for Ultram.  Patient is currently uninsured but hopefully will  obtain insurance through his workplace but patient is still in his initial 90-day trial.  Patient will obtain x-rays if his pain continues and hopefully soon patient can also be referred to orthopedics for further evaluation and treatment - DG Thoracic Spine 1 View; Future - DG Cervical Spine Complete; Future - traMADol (ULTRAM) 50 MG tablet; Take 1 tablet (50 mg total) by mouth every 12 (twelve) hours as needed.  Dispense: 30 tablet; Refill: 2  An After Visit Summary was printed and given to the patient. Allergies as of 01/26/2019   No Known Allergies     Medication List       Accurate as of January 26, 2019  6:20 PM. Always use your most recent med list.        albuterol 108 (90 Base) MCG/ACT inhaler Commonly known as:  PROVENTIL HFA;VENTOLIN HFA Inhale 2 puffs into the lungs every 6 (six) hours as needed for wheezing or shortness of breath.   budesonide-formoterol 160-4.5 MCG/ACT inhaler Commonly known as:  SYMBICORT Inhale 2 puffs into the lungs 2 (two) times daily.   diclofenac sodium 1 % Gel Commonly known as:  VOLTAREN Apply 4 g topically 4 (four) times daily.   doxycycline 100 MG tablet Commonly known as:  VIBRA-TABS Take 1 tablet (100 mg total) by mouth 2 (two) times daily.   ibuprofen 600 MG tablet Commonly known as:  ADVIL,MOTRIN Take 1 tablet (600 mg total) by mouth every 8 (eight) hours as needed. Take after eating   methocarbamol 500 MG tablet Commonly known as:  ROBAXIN Take 1 tablet (500 mg total) by mouth at bedtime as needed for muscle spasms.   omeprazole 40 MG capsule Commonly known as:  PRILOSEC Take 30- 60 min before  your first and last meals of the day   predniSONE 20 MG tablet Commonly known as:  DELTASONE Take 2 pills once per day for 5 days; take after eating   traMADol 50 MG tablet Commonly known as:  ULTRAM Take 1 tablet (50 mg total) by mouth every 12 (twelve) hours as needed.       Follow-up: Return in about 2 months (around 03/28/2019) for tobacco use-Luke in 3 weeks; .   Cain Saupe, MD

## 2019-01-29 ENCOUNTER — Other Ambulatory Visit: Payer: Self-pay | Admitting: Family Medicine

## 2019-01-29 ENCOUNTER — Ambulatory Visit (HOSPITAL_COMMUNITY)
Admission: RE | Admit: 2019-01-29 | Discharge: 2019-01-29 | Disposition: A | Discharge status: Home / Self Care | Payer: Medicaid Other | Source: Ambulatory Visit | Attending: Family Medicine | Admitting: Family Medicine

## 2019-01-29 DIAGNOSIS — M546 Pain in thoracic spine: Principal | ICD-10-CM

## 2019-01-29 DIAGNOSIS — M25512 Pain in left shoulder: Secondary | ICD-10-CM | POA: Insufficient documentation

## 2019-01-29 DIAGNOSIS — G8929 Other chronic pain: Secondary | ICD-10-CM

## 2019-01-29 DIAGNOSIS — M25511 Pain in right shoulder: Secondary | ICD-10-CM

## 2019-02-02 ENCOUNTER — Telehealth: Payer: Self-pay | Admitting: *Deleted

## 2019-02-02 NOTE — Telephone Encounter (Signed)
-----   Message from Cain Saupe, MD sent at 02/01/2019 10:15 PM EDT ----- Xray of the cervical spine shows mild degenerative changes at C4-5

## 2019-02-02 NOTE — Telephone Encounter (Signed)
Patient verified DOB Patient is aware of degenerative changes being noted on the xray. Patient made aware of financial programs through [employer] to get him back referred to the orthopedic. Patient cancelled the appointment from 09/2018 due to only having family planning insurance.

## 2019-02-04 ENCOUNTER — Telehealth: Payer: Self-pay | Admitting: *Deleted

## 2019-02-04 NOTE — Telephone Encounter (Signed)
Patient verified DOB Patient is aware of degenerative changes being noted on spine xray. Patient will pick up financial packet today and return on Friday for processing so that referral to PT may be replaced. No further questions at this time.

## 2019-02-12 MED FILL — ALBUTEROL SULFATE HFA 108 (: 108 (90 BAS | 25 days supply | Qty: 18 | Fill #0

## 2019-02-12 MED FILL — DOXYCYCLINE HYCLATE 100 MG: 100 | 10 days supply | Qty: 20 | Fill #0

## 2019-02-12 MED FILL — traMADol HCL 50 MG TABS: 50 | 15 days supply | Qty: 30 | Fill #0

## 2019-02-12 MED FILL — predniSONE 20 MG TABS: 20 | 5 days supply | Qty: 10 | Fill #0

## 2019-04-06 ENCOUNTER — Encounter (HOSPITAL_COMMUNITY): Payer: Self-pay

## 2019-04-06 ENCOUNTER — Other Ambulatory Visit: Payer: Self-pay

## 2019-04-06 ENCOUNTER — Ambulatory Visit (HOSPITAL_COMMUNITY)
Admission: EM | Admit: 2019-04-06 | Discharge: 2019-04-06 | Disposition: A | Discharge status: Home / Self Care | Payer: Medicaid Other | Attending: Family Medicine | Admitting: Family Medicine

## 2019-04-06 DIAGNOSIS — H9201 Otalgia, right ear: Secondary | ICD-10-CM

## 2019-04-06 NOTE — Discharge Instructions (Addendum)
I am not seeing anything concerning here today. I do not believe this is an infection.  May be some mild inflammation possible from bug bite.  Keep watching and return if worsens  You can try ice to the area or ibuprofen for pain and inflammation as needed.

## 2019-04-06 NOTE — ED Triage Notes (Signed)
Patient presents to Urgent Care with complaints of a mass/lump behind his right ear since 2-3 days ago. Patient reports it is not painful, but has been growing in size.

## 2019-04-06 NOTE — ED Provider Notes (Signed)
[facility]    CSN: [id number] Arrival date & time: 04/06/19  1019     History   Chief Complaint Chief Complaint  Patient presents with  . Abscess    HPI Aleatha BorerJimmy W Dicioccio is a 38 y.o. male.   Pt is a 38 year old male that presents with pain and mild swelling behind the right ear. This has been present and worsening over the past 2 days. Reports started as itchy and then some swelling appeared. Denies any current pain. He had some ringing in the ear but that resolved. No inner ear pain or drainage. No fever. No injury. He has not done anything to treat the symptoms.   ROS per HPI       Past Medical History:  Diagnosis Date  . COPD (chronic obstructive pulmonary disease) (HCC)   . Tobacco dependence     Patient Active Problem List   Diagnosis Date Noted  . DOE (dyspnea on exertion) 09/02/2018  . COPD GOLD II/ still smoking  09/02/2018  . Cigarette smoker 09/02/2018    Past Surgical History:  Procedure Laterality Date  . BRAIN SURGERY    . INGUINAL HERNIA REPAIR     occurred in childhood       Home Medications    Prior to Admission medications   Medication Sig Start Date End Date Taking? Authorizing Provider  albuterol (PROVENTIL HFA;VENTOLIN HFA) 108 (90 Base) MCG/ACT inhaler Inhale 2 puffs into the lungs every 6 (six) hours as needed for wheezing or shortness of breath. 01/26/19   [name], [name], MD  budesonide-formoterol (SYMBICORT) 160-4.5 MCG/ACT inhaler Inhale 2 puffs into the lungs 2 (two) times daily. 09/02/18   Nyoka CowdenWert, Michael B, MD  diclofenac sodium (VOLTAREN) 1 % GEL Apply 4 g topically 4 (four) times daily. 12/22/18   Melene PlanFloyd, Dan, DO  doxycycline (VIBRA-TABS) 100 MG tablet Take 1 tablet (100 mg total) by mouth 2 (two) times daily. 01/26/19   [name], [name], MD  ibuprofen (ADVIL,MOTRIN) 600 MG tablet Take 1 tablet (600 mg total) by mouth every 8 (eight) hours as needed. Take after eating 10/09/18   [name], [name], MD  methocarbamol (ROBAXIN) 500 MG  tablet Take 1 tablet (500 mg total) by mouth at bedtime as needed for muscle spasms. 10/09/18   [name], [name], MD  omeprazole (PRILOSEC) 40 MG capsule Take 30- 60 min before your first and last meals of the day 09/02/18   Nyoka CowdenWert, Michael B, MD  predniSONE (DELTASONE) 20 MG tablet Take 2 pills once per day for 5 days; take after eating 01/26/19   [name], [name], MD  traMADol (ULTRAM) 50 MG tablet Take 1 tablet (50 mg total) by mouth every 12 (twelve) hours as needed. 01/26/19   Cain SaupeFulp, [name], MD    Family History Family History  Problem Relation Age of Onset  . Diabetes Mother   . COPD Mother   . CAD Father   . Throat cancer Father     Social History Social History   Tobacco Use  . Smoking status: Current Every Day Smoker    Packs/day: 1.50    Years: 26.00    Pack years: 39.00    Types: Cigarettes  . Smokeless tobacco: Never Used  Substance Use Topics  . Alcohol use: Yes  . Drug use: Yes    Types: Marijuana     Allergies   Patient has no known allergies.   Review of Systems Review of Systems   Physical Exam Triage Vital Signs ED Triage Vitals  Enc Vitals Group     BP 04/06/19 1036 (!) 157/83     Pulse Rate 04/06/19 1036 63     Resp 04/06/19 1036 18     Temp 04/06/19 1036 98.6 F (37 C)     Temp Source 04/06/19 1036 Oral     SpO2 04/06/19 1036 98 %     Weight 04/06/19 1033 150 lb (68 kg)     Height 04/06/19 1033 5\' 8"  (1.727 m)     Head Circumference --      Peak Flow --      Pain Score 04/06/19 1033 0     Pain Loc --      Pain Edu? --      Excl. in GC? --    No data found.  Updated Vital Signs BP (!) 157/83 (BP Location: Right Arm)   Pulse 63   Temp 98.6 F (37 C) (Oral)   Resp 18   Ht 5\' 8"  (1.727 m)   Wt 150 lb (68 kg)   SpO2 98%   BMI 22.81 kg/m   Visual Acuity Right Eye Distance:   Left Eye Distance:   Bilateral Distance:    Right Eye Near:   Left Eye Near:    Bilateral Near:     Physical Exam Vitals signs and nursing note reviewed.   Constitutional:      General: He is not in acute distress.    Appearance: Normal appearance. He is not ill-appearing, toxic-appearing or diaphoretic.  HENT:     Head: Normocephalic and atraumatic.     Right Ear: Tympanic membrane and ear canal normal.     Left Ear: Tympanic membrane, ear canal and external ear normal.     Ears:     Comments: Very mild swelling and erythema behind the right ear. Non tender to palpation. No fluctuance.     Mouth/Throat:     Pharynx: Oropharynx is clear.  Eyes:     Conjunctiva/sclera: Conjunctivae normal.  Neck:     Musculoskeletal: Normal range of motion. No muscular tenderness.  Pulmonary:     Effort: Pulmonary effort is normal.  Musculoskeletal: Normal range of motion.  Lymphadenopathy:     Cervical: No cervical adenopathy.  Skin:    General: Skin is warm and dry.     Findings: No rash.  Neurological:     Mental Status: He is alert.  Psychiatric:        Mood and Affect: Mood normal.        UC Treatments / Results  Labs (all labs ordered are listed, but only abnormal results are displayed) Labs Reviewed - No data to display  EKG None  Radiology No results found.  Procedures Procedures (including critical care time)  Medications Ordered in UC Medications - No data to display  Initial Impression / Assessment and Plan / UC Course  I have reviewed the triage vital signs and the nursing notes.  Pertinent labs & imaging results that were available during my care of the patient were reviewed by me and considered in my medical decision making (see chart for details).    Ear pain  Pt with mild swelling and pain behind the right ear.  There is no pain No concern for mastoiditis.  No inner ear infection.  Instructed pt to watch the area and if it worsens he will need to be revaluated He can use ice and ibuprofen as needed.  Follow up as needed for continued or worsening symptoms Pt understanding  and agreed   Final Clinical  Impressions(s) / UC Diagnoses   Final diagnoses:  Ear pain, right     Discharge Instructions     I am not seeing anything concerning here today. I do not believe this is an infection.  May be some mild inflammation possible from bug bite.  Keep watching and return if worsens  You can try ice to the area or ibuprofen for pain and inflammation as needed.     ED Prescriptions    None     Controlled Substance Prescriptions [place] Controlled Substance Registry consulted? Not Applicable   [name], NP 04/06/19 1205

## 2019-05-06 MED FILL — SYMBICORT 160-4.5 MCG INH: 160-4.5 | 30 days supply | Qty: 10 | Fill #1

## 2019-05-06 MED FILL — ALBUTEROL SULFATE HFA 108 (: 108 (90 BAS | 25 days supply | Qty: 18 | Fill #1

## 2019-05-15 ENCOUNTER — Ambulatory Visit: Payer: Medicaid Other | Admitting: Family Medicine

## 2019-07-19 ENCOUNTER — Other Ambulatory Visit: Payer: Self-pay

## 2019-07-19 ENCOUNTER — Emergency Department (HOSPITAL_COMMUNITY): Payer: Self-pay

## 2019-07-19 ENCOUNTER — Emergency Department (HOSPITAL_COMMUNITY)
Admission: EM | Admit: 2019-07-19 | Discharge: 2019-07-19 | Disposition: A | Discharge status: Home / Self Care | Payer: Self-pay | Attending: Emergency Medicine | Admitting: Emergency Medicine

## 2019-07-19 ENCOUNTER — Encounter (HOSPITAL_COMMUNITY): Payer: Self-pay

## 2019-07-19 DIAGNOSIS — M419 Scoliosis, unspecified: Secondary | ICD-10-CM

## 2019-07-19 DIAGNOSIS — F17210 Nicotine dependence, cigarettes, uncomplicated: Secondary | ICD-10-CM | POA: Insufficient documentation

## 2019-07-19 DIAGNOSIS — F1210 Cannabis abuse, uncomplicated: Secondary | ICD-10-CM | POA: Insufficient documentation

## 2019-07-19 DIAGNOSIS — G8929 Other chronic pain: Secondary | ICD-10-CM

## 2019-07-19 DIAGNOSIS — R079 Chest pain, unspecified: Secondary | ICD-10-CM | POA: Insufficient documentation

## 2019-07-19 DIAGNOSIS — Z79899 Other long term (current) drug therapy: Secondary | ICD-10-CM | POA: Insufficient documentation

## 2019-07-19 DIAGNOSIS — M545 Low back pain, unspecified: Secondary | ICD-10-CM

## 2019-07-19 DIAGNOSIS — J449 Chronic obstructive pulmonary disease, unspecified: Secondary | ICD-10-CM | POA: Insufficient documentation

## 2019-07-19 DIAGNOSIS — R05 Cough: Secondary | ICD-10-CM | POA: Insufficient documentation

## 2019-07-19 DIAGNOSIS — M546 Pain in thoracic spine: Secondary | ICD-10-CM | POA: Insufficient documentation

## 2019-07-19 MED ORDER — KETOROLAC TROMETHAMINE 30 MG/ML IJ SOLN
30.0000 mg | Freq: Once | INTRAMUSCULAR | Status: AC
  Administered 2019-07-19: 13:00:00 30 mg via INTRAMUSCULAR
  Filled 2019-07-19: qty 1

## 2019-07-19 MED ORDER — METHOCARBAMOL 500 MG PO TABS: 500.0000 mg | ORAL_TABLET | Freq: Three times a day (TID) | ORAL | 3 refills | Status: DC | PRN

## 2019-07-19 MED ORDER — IBUPROFEN 600 MG PO TABS: 600.0000 mg | ORAL_TABLET | Freq: Three times a day (TID) | ORAL | 3 refills | Status: DC | PRN

## 2019-07-19 NOTE — ED Notes (Signed)
Patient verbalizes understanding of discharge instructions. Opportunity for questioning and answers were provided. pt discharged from ED. Ambulatory by self  

## 2019-07-19 NOTE — ED Provider Notes (Signed)
[name] Provider Note   CSN: [id number] Arrival date & time: 07/19/19  1105     History   Chief Complaint Chief Complaint  Patient presents with  . Back Pain  . Chest Pain    HPI Douglas Guzman is a 38 y.o. male.  He has a history of COPD and smokes.  He is complaining of worsening upper back and chest pain that started yesterday but worse this morning.  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  He takes NSAIDs and muscle relaxants without any improvement.  His PCP had referred him onto a spine doctor but it is not of insurance that would cover it.  He does not recall any new trauma.  He has a chronic cough productive of some sputum.  No fevers or chills.  No nausea vomiting.  Sometimes gets loose stool.  No hemoptysis     The history is provided by the patient.  Back Pain Location:  Thoracic spine Quality:  Aching Radiates to:  R shoulder and L shoulder Pain severity:  Moderate Pain is:  Same all the time Onset quality:  Unable to specify Timing:  Intermittent Progression:  Worsening Chronicity:  Chronic Context: not recent illness and not recent injury   Relieved by:  Nothing Worsened by:  Twisting and bending Ineffective treatments:  NSAIDs and OTC medications Associated symptoms: chest pain   Associated symptoms: no abdominal pain, no bladder incontinence, no bowel incontinence, no dysuria, no fever, no headaches, no tingling and no weakness   Chest Pain Pain location:  Substernal area Pain quality: aching   Pain radiates to:  L shoulder and R shoulder Pain severity:  Moderate Onset quality:  Gradual Timing:  Constant Progression:  Unchanged Chronicity:  Chronic Context: movement and raising an arm   Relieved by:  Nothing Worsened by:  Exertion and movement Associated symptoms: back pain and cough   Associated symptoms: no abdominal pain, no  fever, no headache, no nausea, no shortness of breath and no weakness   Risk factors: male sex and smoking     Past Medical History:  Diagnosis Date  . COPD (chronic obstructive pulmonary disease) (HCC)   . Tobacco dependence     Patient Active Problem List   Diagnosis Date Noted  . DOE (dyspnea on exertion) 09/02/2018  . COPD GOLD II/ still smoking  09/02/2018  . Cigarette smoker 09/02/2018    Past Surgical History:  Procedure Laterality Date  . BRAIN SURGERY    . INGUINAL HERNIA REPAIR     occurred in childhood        Home Medications    Prior to Admission medications   Medication Sig Start Date End Date Taking? Authorizing Provider  albuterol (PROVENTIL HFA;VENTOLIN HFA) 108 (90 Base) MCG/ACT inhaler Inhale 2 puffs into the lungs every 6 (six) hours as needed for wheezing or shortness of breath. 01/26/19   [name], [name], MD  budesonide-formoterol (SYMBICORT) 160-4.5 MCG/ACT inhaler Inhale 2 puffs into the lungs 2 (two) times daily. 09/02/18   Nyoka CowdenWert, Jing Howatt B, MD  diclofenac sodium (VOLTAREN) 1 % GEL Apply 4 g topically 4 (four) times daily. 12/22/18   Melene PlanFloyd, Dan, DO  doxycycline (VIBRA-TABS) 100 MG tablet Take 1 tablet (100 mg total) by mouth 2 (two) times daily. 01/26/19   [name], [name], MD  ibuprofen (ADVIL,MOTRIN) 600 MG tablet Take 1 tablet (600 mg total) by mouth every  8 (eight) hours as needed. Take after eating 10/09/18   [name], [name], MD  methocarbamol (ROBAXIN) 500 MG tablet Take 1 tablet (500 mg total) by mouth at bedtime as needed for muscle spasms. 10/09/18   [name], [name], MD  omeprazole (PRILOSEC) 40 MG capsule Take 30- 60 min before your first and last meals of the day 09/02/18   Nyoka CowdenWert, Elijio Staples B, MD  predniSONE (DELTASONE) 20 MG tablet Take 2 pills once per day for 5 days; take after eating 01/26/19   [name], [name], MD  traMADol (ULTRAM) 50 MG tablet Take 1 tablet (50 mg total) by mouth every 12 (twelve) hours as needed. 01/26/19   Cain SaupeFulp, [name], MD    Family History  Family History  Problem Relation Age of Onset  . Diabetes Mother   . COPD Mother   . CAD Father   . Throat cancer Father     Social History Social History   Tobacco Use  . Smoking status: Current Every Day Smoker    Packs/day: 1.50    Years: 26.00    Pack years: 39.00    Types: Cigarettes  . Smokeless tobacco: Never Used  Substance Use Topics  . Alcohol use: Yes  . Drug use: Yes    Types: Marijuana     Allergies   Patient has no known allergies.   Review of Systems Review of Systems  Constitutional: Negative for fever.  HENT: Negative for sore throat.   Eyes: Negative for visual disturbance.  Respiratory: Positive for cough. Negative for shortness of breath.   Cardiovascular: Positive for chest pain.  Gastrointestinal: Negative for abdominal pain, bowel incontinence and nausea.  Genitourinary: Negative for bladder incontinence and dysuria.  Musculoskeletal: Positive for back pain.  Skin: Negative for rash.  Neurological: Negative for tingling, weakness and headaches.     Physical Exam Updated Vital Signs BP (!) 143/90 (BP Location: Right Arm)   Pulse 72   Temp 98.5 F (36.9 C) (Oral)   Resp 15   SpO2 100%   Physical Exam Vitals signs and nursing note reviewed.  Constitutional:      Appearance: He is well-developed.  HENT:     Head: Normocephalic and atraumatic.  Eyes:     Conjunctiva/sclera: Conjunctivae normal.  Neck:     Musculoskeletal: Neck supple.  Cardiovascular:     Rate and Rhythm: Normal rate and regular rhythm.     Heart sounds: No murmur.  Pulmonary:     Effort: Pulmonary effort is normal. No respiratory distress.     Breath sounds: Normal breath sounds.  Chest:     Chest wall: Tenderness (diffuse tenderness anterior and posterior chest wall) present. No mass or crepitus.  Abdominal:     Palpations: Abdomen is soft.     Tenderness: There is no abdominal tenderness.  Musculoskeletal: Normal range of motion.     Right lower leg: He  exhibits no tenderness.     Left lower leg: He exhibits no tenderness.  Skin:    General: Skin is warm and dry.     Capillary Refill: Capillary refill takes less than 2 seconds.  Neurological:     General: No focal deficit present.     Mental Status: He is alert and oriented to person, place, and time.      ED Treatments / Results  Labs (all labs ordered are listed, but only abnormal results are displayed) Labs Reviewed - No data to display  EKG EKG Interpretation  Date/Time:  Sunday July 19 2019  11:32:55 EDT Ventricular Rate:  72 PR Interval:    QRS Duration: 102 QT Interval:  432 QTC Calculation: 473 R Axis:   -71 Text Interpretation:  Sinus rhythm Consider right atrial enlargement Markedly posterior QRS axis RSR' in V1 or V2, probably normal variant Left ventricular hypertrophy similar to prior 7/19 Confirmed by Aletta Edouard [id number]) on 07/19/2019 11:38:23 AM   Radiology Dg Chest Port 1 View  Result Date: 07/19/2019 CLINICAL DATA:  Cough, chest pain EXAM: PORTABLE CHEST 1 VIEW COMPARISON:  None FINDINGS: The heart size and mediastinal contours are within normal limits. Both lungs are clear. The visualized skeletal structures are unremarkable. IMPRESSION: No active disease. Electronically Signed   By: Kathreen Devoid   On: 07/19/2019 12:30    Procedures Procedures (including critical care time)  Medications Ordered in ED Medications - No data to display   Initial Impression / Assessment and Plan / ED Course  I have reviewed the triage vital signs and the nursing notes.  Pertinent labs & imaging results that were available during my care of the patient were reviewed by me and considered in my medical decision making (see chart for details).  Clinical Course as of Jul 19 842  Sun Jul 19, 8075  6267 38 year old male with history of COPD here with chest and back pain worsening since last night.  He says he has this pain chronically but is been worse over the last day  or so.  No new trauma.  Regular NSAIDs and muscle relaxants are not helping.  Chronic cough.  He is nontoxic-appearing and has reproducible muscular tenderness.  Pulse ox 99 to 100% on room air.  Checking EKG and chest x-ray.   [MB]  0630 Patient's chest x-ray not showing any acute disease.  Awaiting radiology reading.   [MB]    Clinical Course User Index [MB] Douglas Rasmussen, MD        Final Clinical Impressions(s) / ED Diagnoses   Final diagnoses:  Acute bilateral thoracic back pain  Nonspecific chest pain    ED Discharge Orders         Ordered    ibuprofen (ADVIL) 600 MG tablet  Every 8 hours PRN     07/19/19 1248    methocarbamol (ROBAXIN) 500 MG tablet  Every 8 hours PRN     07/19/19 1248           Douglas Rasmussen, MD 07/20/19 [phone]

## 2019-07-19 NOTE — ED Triage Notes (Signed)
Patient arrived POV with c/o of back pain and chest pain that started this morning when he woke up. C/o SOB as well.

## 2019-07-19 NOTE — Discharge Instructions (Signed)
You were seen in the emergency department for upper back and chest pain.  You had an EKG and a chest x-ray.  This pain is likely muscular and we are refilling your prescriptions of ibuprofen and Robaxin.  Please contact your primary care doctor for close follow-up.  Return if any worsening symptoms.

## 2019-07-20 ENCOUNTER — Other Ambulatory Visit: Payer: Self-pay

## 2019-07-20 ENCOUNTER — Ambulatory Visit (HOSPITAL_COMMUNITY)
Admission: EM | Admit: 2019-07-20 | Discharge: 2019-07-20 | Disposition: A | Discharge status: Home / Self Care | Payer: Medicaid Other | Attending: Emergency Medicine | Admitting: Emergency Medicine

## 2019-07-20 ENCOUNTER — Encounter (HOSPITAL_COMMUNITY): Payer: Self-pay

## 2019-07-20 DIAGNOSIS — J449 Chronic obstructive pulmonary disease, unspecified: Secondary | ICD-10-CM

## 2019-07-20 DIAGNOSIS — M546 Pain in thoracic spine: Secondary | ICD-10-CM

## 2019-07-20 DIAGNOSIS — J441 Chronic obstructive pulmonary disease with (acute) exacerbation: Secondary | ICD-10-CM

## 2019-07-20 MED ORDER — TRAMADOL HCL 50 MG PO TABS: 50.0000 mg | ORAL_TABLET | Freq: Four times a day (QID) | ORAL | 0 refills | Status: DC | PRN

## 2019-07-20 MED ORDER — PREDNISONE 10 MG (21) PO TBPK: ORAL_TABLET | ORAL | 0 refills | Status: DC

## 2019-07-20 MED FILL — traMADol HCL 50 MG TABS: 50 | 3 days supply | Qty: 12 | Fill #0

## 2019-07-20 MED FILL — predniSONE 10 MG TABS: 10 | 6 days supply | Qty: 21 | Fill #0

## 2019-07-20 NOTE — ED Triage Notes (Signed)
Pt states he went to the ER yesterday for back and chest pain. Pt states his back pain is worst today.

## 2019-07-20 NOTE — Discharge Instructions (Signed)
Take the medication as prescribed  Follow up with PCP for continued or worsening symptoms.  For worsening and severe symptoms go to the ER.

## 2019-07-21 LAB — POCT URINALYSIS DIP (DEVICE)
Bilirubin Urine: NEGATIVE
Glucose, UA: NEGATIVE mg/dL
Hgb urine dipstick: NEGATIVE
Ketones, ur: NEGATIVE mg/dL
Leukocytes,Ua: NEGATIVE
Nitrite: NEGATIVE
Protein, ur: NEGATIVE mg/dL
Specific Gravity, Urine: >=1.03 (ref 1.005–1.030)
Urobilinogen, UA: 1 mg/dL (ref 0.0–1.0)
pH: 5.5 (ref 5.0–8.0)

## 2019-07-21 NOTE — ED Provider Notes (Signed)
[place]    CSN: [id number] Arrival date & time: 07/20/19  1419      History   Chief Complaint Chief Complaint  Patient presents with  . Back Pain    HPI Douglas Guzman is a 38 y.o. male.   Pt is a 38 year old male with PMH of COPD that presents with upper back pain, neck pain. This has been present for the past few days. Worse today. He was seen in the ER yesterday for same. Had a work up to include EKG, chest xray, and was given Toradol. He has also been taking muscle relaxant which doesn't help. Denies any injuries to the back. 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  Denies any history of back surgeries.  Reporting some numbness and tingling at times with sharp pains and radiation.  No cough, chest congestion, fevers, chills or body aches.  He is also been taking ibuprofen without much relief.  ROS per HPI      Past Medical History:  Diagnosis Date  . COPD (chronic obstructive pulmonary disease) ([place])   . Tobacco dependence     Patient Active Problem List   Diagnosis Date Noted  . DOE (dyspnea on exertion) 09/02/2018  . COPD GOLD II/ still smoking  09/02/2018  . Cigarette smoker 09/02/2018    Past Surgical History:  Procedure Laterality Date  . BRAIN SURGERY    . INGUINAL HERNIA REPAIR     occurred in childhood       Home Medications    Prior to Admission medications   Medication Sig Start Date End Date Taking? Authorizing Provider  albuterol (PROVENTIL HFA;VENTOLIN HFA) 108 (90 Base) MCG/ACT inhaler Inhale 2 puffs into the lungs every 6 (six) hours as needed for wheezing or shortness of breath. 01/26/19   [name], [name], MD  budesonide-formoterol (SYMBICORT) 160-4.5 MCG/ACT inhaler Inhale 2 puffs into the lungs 2 (two) times daily. 09/02/18   Tanda Rockers, MD  diclofenac sodium (VOLTAREN) 1 % GEL Apply 4 g topically 4 (four) times daily. 12/22/18   Deno Etienne, DO  doxycycline (VIBRA-TABS) 100 MG tablet Take 1 tablet (100 mg total) by  mouth 2 (two) times daily. 01/26/19   [name], [name], MD  ibuprofen (ADVIL) 600 MG tablet Take 1 tablet (600 mg total) by mouth every 8 (eight) hours as needed for moderate pain. Take after eating 07/19/19   Hayden Rasmussen, MD  methocarbamol (ROBAXIN) 500 MG tablet Take 1 tablet (500 mg total) by mouth every 8 (eight) hours as needed for muscle spasms. 07/19/19   Hayden Rasmussen, MD  omeprazole (PRILOSEC) 40 MG capsule Take 30- 60 min before your first and last meals of the day 09/02/18   Tanda Rockers, MD  predniSONE (STERAPRED UNI-PAK 21 TAB) 10 MG (21) TBPK tablet 6 tabs for 1 day, then 5 tabs for 1 das, then 4 tabs for 1 day, then 3 tabs for 1 day, 2 tabs for 1 day, then 1 tab for 1 day 07/20/19   Loura Halt A, NP  traMADol (ULTRAM) 50 MG tablet Take 1 tablet (50 mg total) by mouth every 6 (six) hours as needed. 07/20/19   Orvan July, NP    Family History Family History  Problem Relation Age of Onset  . Diabetes Mother   . COPD Mother   . CAD Father   . Throat cancer Father     Social History Social History   Tobacco Use  .  Smoking status: Current Every Day Smoker    Packs/day: 2.00    Years: 26.00    Pack years: 52.00    Types: Cigarettes  . Smokeless tobacco: Never Used  Substance Use Topics  . Alcohol use: Yes  . Drug use: Yes    Types: Marijuana     Allergies   Patient has no known allergies.   Review of Systems Review of Systems   Physical Exam Triage Vital Signs ED Triage Vitals  Enc Vitals Group     BP 07/20/19 1502 (!) 127/92     Pulse Rate 07/20/19 1502 66     Resp 07/20/19 1502 16     Temp 07/20/19 1502 (!) 97.4 F (36.3 C)     Temp Source 07/20/19 1502 Oral     SpO2 07/20/19 1502 98 %     Weight 07/20/19 1501 145 lb (65.8 kg)     Height --      Head Circumference --      Peak Flow --      Pain Score 07/20/19 1501 10     Pain Loc --      Pain Edu? --      Excl. in GC? --    No data found.  Updated Vital Signs BP (!) 127/92 (BP Location:  Right Arm)   Pulse 66   Temp (!) 97.4 F (36.3 C) (Oral)   Resp 16   Wt 145 lb (65.8 kg)   SpO2 98%   BMI 22.05 kg/m   Visual Acuity Right Eye Distance:   Left Eye Distance:   Bilateral Distance:    Right Eye Near:   Left Eye Near:    Bilateral Near:     Physical Exam Vitals signs and nursing note reviewed.  Constitutional:      General: He is not in acute distress.    Appearance: Normal appearance. He is not ill-appearing, toxic-appearing or diaphoretic.  HENT:     Head: Normocephalic and atraumatic.     Nose: Nose normal.     Mouth/Throat:     Pharynx: Oropharynx is clear.  Eyes:     Conjunctiva/sclera: Conjunctivae normal.  Neck:     Musculoskeletal: Normal range of motion and neck supple. Muscular tenderness present. No neck rigidity.  Cardiovascular:     Rate and Rhythm: Normal rate and regular rhythm.  Pulmonary:     Effort: Pulmonary effort is normal.     Breath sounds: Normal breath sounds.  Musculoskeletal: Normal range of motion.     Thoracic back: He exhibits tenderness.       Back:  Lymphadenopathy:     Cervical: No cervical adenopathy.  Skin:    General: Skin is warm and dry.     Findings: No rash.  Neurological:     Mental Status: He is alert.  Psychiatric:        Mood and Affect: Mood normal.      UC Treatments / Results  Labs (all labs ordered are listed, but only abnormal results are displayed) Labs Reviewed - No data to display  EKG   Radiology Dg Chest [facility]: [facility] Rh[facility] Wort 1 View  Result Date: 07/19/2019 CLINICAL DATA:  Cough, chest pain EXAM: PORTABLE CHEST 1 VIEW COMPARISON:  None FINDINGS: The heart size and mediastinal contours are within normal limits. Both lungs are clear. The visualized skeletal structures are unremarkable. IMPRESSION: No active disease. Electronically Signed   By: Elige KoHetal  Patel   On: 07/19/2019 12:30    Procedures Procedures (including  critical care time)  Medications Ordered in UC Medications - No data to display   Initial Impression / Assessment and Plan / UC Course  I have reviewed the triage vital signs and the nursing notes.  Pertinent labs & imaging results that were available during my care of the patient were reviewed by me and considered in my medical decision making (see chart for details).     Patient is a 38 year old male with past medical history of COPD.  He appears well today in no acute distress.  Vital signs within normal limits.   Patient had a work-up in the ER yesterday with normal EKG and normal chest x-ray. Pt with neck pain, radiation into the upper back with some radiculopathy.  Will treat with prednisone and tramadol for severe pain.  He can continue the muscle relaxant.  Recommended follow up with PCP for any continued or worse problems and possible imaging.    Final Clinical Impressions(s) / UC Diagnoses   Final diagnoses:  Acute midline thoracic back pain     Discharge Instructions     Take the medication as prescribed  Follow up with PCP for continued or worsening symptoms.  For worsening and severe symptoms go to the ER.     ED Prescriptions    Medication Sig Dispense Auth. Provider   predniSONE (STERAPRED UNI-PAK 21 TAB) 10 MG (21) TBPK tablet 6 tabs for 1 day, then 5 tabs for 1 das, then 4 tabs for 1 day, then 3 tabs for 1 day, 2 tabs for 1 day, then 1 tab for 1 day 21 tablet Gurpreet Mikhail A, NP   traMADol (ULTRAM) 50 MG tablet Take 1 tablet (50 mg total) by mouth every 6 (six) hours as needed. 12 tablet Dahlia Byes A, NP     Controlled Substance Prescriptions [place] Controlled Substance Registry consulted? Not Applicable   [name], NP 07/21/19 [phone]

## 2019-10-15 ENCOUNTER — Emergency Department (HOSPITAL_COMMUNITY)
Admission: EM | Admit: 2019-10-15 | Discharge: 2019-10-15 | Disposition: A | Discharge status: Home / Self Care | Payer: Medicaid Other | Attending: Emergency Medicine | Admitting: Emergency Medicine

## 2019-10-15 ENCOUNTER — Other Ambulatory Visit: Payer: Self-pay

## 2019-10-15 DIAGNOSIS — M546 Pain in thoracic spine: Secondary | ICD-10-CM | POA: Insufficient documentation

## 2019-10-15 DIAGNOSIS — Y999 Unspecified external cause status: Secondary | ICD-10-CM | POA: Insufficient documentation

## 2019-10-15 DIAGNOSIS — F17210 Nicotine dependence, cigarettes, uncomplicated: Secondary | ICD-10-CM | POA: Insufficient documentation

## 2019-10-15 DIAGNOSIS — S161XXA Strain of muscle, fascia and tendon at neck level, initial encounter: Secondary | ICD-10-CM | POA: Insufficient documentation

## 2019-10-15 DIAGNOSIS — Y939 Activity, unspecified: Secondary | ICD-10-CM | POA: Insufficient documentation

## 2019-10-15 DIAGNOSIS — Y92810 Car as the place of occurrence of the external cause: Secondary | ICD-10-CM | POA: Insufficient documentation

## 2019-10-15 DIAGNOSIS — Z79899 Other long term (current) drug therapy: Secondary | ICD-10-CM | POA: Insufficient documentation

## 2019-10-15 DIAGNOSIS — J449 Chronic obstructive pulmonary disease, unspecified: Secondary | ICD-10-CM | POA: Insufficient documentation

## 2019-10-15 MED ORDER — NAPROXEN 500 MG PO TABS: 500.0000 mg | ORAL_TABLET | Freq: Two times a day (BID) | ORAL | 0 refills | Status: DC

## 2019-10-15 MED ORDER — CYCLOBENZAPRINE HCL 10 MG PO TABS: 10.0000 mg | ORAL_TABLET | Freq: Two times a day (BID) | ORAL | 0 refills | Status: DC | PRN
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
## 2019-10-15 NOTE — ED Triage Notes (Signed)
Pt here for neck and upper back pain. Pt sts he fell yesterday and woke up sore this morning. Hx of back problems.

## 2019-10-15 NOTE — ED Notes (Signed)
Patient verbalizes understanding of discharge instructions. Opportunity for questioning and answers were provided. Armband removed by staff, pt discharged from ED.  

## 2019-10-15 NOTE — Discharge Instructions (Addendum)
Please read instructions below. Apply ice to your neck for 20 minutes at a time. You can take naproxen every 12 hours as needed for pain. You can take flexeril every 12 hours for muscle spasm. Do not drink alcohol or drive while taking this medication. Schedule an appointment with your primary care provider to follow-up on your injury. Return to the ER for new or concerning symptoms.

## 2019-11-07 ENCOUNTER — Emergency Department (HOSPITAL_COMMUNITY): Payer: Self-pay

## 2019-11-07 ENCOUNTER — Emergency Department (HOSPITAL_COMMUNITY)
Admission: EM | Admit: 2019-11-07 | Discharge: 2019-11-07 | Disposition: A | Discharge status: Home / Self Care | Payer: Self-pay | Attending: Emergency Medicine | Admitting: Emergency Medicine

## 2019-11-07 ENCOUNTER — Encounter (HOSPITAL_COMMUNITY): Payer: Self-pay | Admitting: Emergency Medicine

## 2019-11-07 ENCOUNTER — Other Ambulatory Visit: Payer: Self-pay

## 2019-11-07 DIAGNOSIS — F17210 Nicotine dependence, cigarettes, uncomplicated: Secondary | ICD-10-CM | POA: Insufficient documentation

## 2019-11-07 DIAGNOSIS — Z20822 Contact with and (suspected) exposure to covid-19: Secondary | ICD-10-CM

## 2019-11-07 DIAGNOSIS — F1210 Cannabis abuse, uncomplicated: Secondary | ICD-10-CM | POA: Insufficient documentation

## 2019-11-07 DIAGNOSIS — R109 Unspecified abdominal pain: Secondary | ICD-10-CM

## 2019-11-07 DIAGNOSIS — Z79899 Other long term (current) drug therapy: Secondary | ICD-10-CM | POA: Insufficient documentation

## 2019-11-07 DIAGNOSIS — J449 Chronic obstructive pulmonary disease, unspecified: Secondary | ICD-10-CM | POA: Insufficient documentation

## 2019-11-07 DIAGNOSIS — Z20828 Contact with and (suspected) exposure to other viral communicable diseases: Secondary | ICD-10-CM | POA: Insufficient documentation

## 2019-11-07 DIAGNOSIS — K529 Noninfective gastroenteritis and colitis, unspecified: Secondary | ICD-10-CM | POA: Insufficient documentation

## 2019-11-07 LAB — COMPREHENSIVE METABOLIC PANEL
ALT: 17 U/L (ref 0–44)
AST: 21 U/L (ref 15–41)
Albumin: 4 g/dL (ref 3.5–5.0)
Alkaline Phosphatase: 69 U/L (ref 38–126)
Anion gap: 11 (ref 5–15)
BUN: 17 mg/dL (ref 6–20)
CO2: 22 mmol/L (ref 22–32)
Calcium: 9 mg/dL (ref 8.9–10.3)
Chloride: 102 mmol/L (ref 98–111)
Creatinine, Ser: 1.31 mg/dL — ABNORMAL HIGH (ref 0.61–1.24)
GFR calc Af Amer: >60 mL/min (ref >60)
GFR calc non Af Amer: >60 mL/min (ref >60)
Glucose, Bld: 131 mg/dL — ABNORMAL HIGH (ref 70–99)
Potassium: 4 mmol/L (ref 3.5–5.1)
Sodium: 135 mmol/L (ref 135–145)
Total Bilirubin: 0.7 mg/dL (ref 0.3–1.2)
Total Protein: 7.1 g/dL (ref 6.5–8.1)

## 2019-11-07 LAB — POC SARS CORONAVIRUS 2 AG -  ED: SARS Coronavirus 2 Ag: NEGATIVE

## 2019-11-07 LAB — CBC
HCT: 53.7 % — ABNORMAL HIGH (ref 39.0–52.0)
Hemoglobin: 18.8 g/dL — ABNORMAL HIGH (ref 13.0–17.0)
MCH: 31.6 pg (ref 26.0–34.0)
MCHC: 35 g/dL (ref 30.0–36.0)
MCV: 90.3 fL (ref 80.0–100.0)
Platelets: 137 10*3/uL — ABNORMAL LOW (ref 150–400)
RBC: 5.95 MIL/uL — ABNORMAL HIGH (ref 4.22–5.81)
RDW: 13.2 % (ref 11.5–15.5)
WBC: 11.4 10*3/uL — ABNORMAL HIGH (ref 4.0–10.5)
nRBC: 0 % (ref 0.0–0.2)

## 2019-11-07 LAB — LIPASE, BLOOD: Lipase: 26 U/L (ref 11–51)

## 2019-11-07 LAB — LACTIC ACID, PLASMA: Lactic Acid, Venous: 1.7 mmol/L (ref 0.5–1.9)

## 2019-11-07 MED ORDER — SODIUM CHLORIDE 0.9 % IV BOLUS
1000.0000 mL | Freq: Once | INTRAVENOUS | Status: AC
  Administered 2019-11-07: 12:00:00 1000 mL via INTRAVENOUS

## 2019-11-07 MED ORDER — METRONIDAZOLE 500 MG PO TABS: 500.0000 mg | ORAL_TABLET | Freq: Two times a day (BID) | ORAL | 0 refills | Status: DC

## 2019-11-07 MED ORDER — CIPROFLOXACIN HCL 500 MG PO TABS: 500.0000 mg | ORAL_TABLET | Freq: Two times a day (BID) | ORAL | 0 refills | Status: AC

## 2019-11-07 MED ORDER — MORPHINE SULFATE (PF) 4 MG/ML IV SOLN
4.0000 mg | Freq: Once | INTRAVENOUS | Status: AC
  Administered 2019-11-07: 13:00:00 4 mg via INTRAVENOUS
  Filled 2019-11-07: qty 1

## 2019-11-07 MED ORDER — SODIUM CHLORIDE 0.9% FLUSH
3.0000 mL | Freq: Once | INTRAVENOUS | Status: AC
  Administered 2019-11-07: 12:00:00 3 mL via INTRAVENOUS

## 2019-11-07 MED ORDER — IOHEXOL 300 MG/ML  SOLN
100.0000 mL | Freq: Once | INTRAMUSCULAR | Status: AC | PRN
  Administered 2019-11-07: 100 mL via INTRAVENOUS

## 2019-11-07 NOTE — Discharge Instructions (Signed)
Please pick up antibiotics and take as prescribed. Follow up with your PCP in 1-2 weeks for recheck and follow up of your ED visit today.   We have swabbed you for COVID 19 today - please stay home and self isolate until you receive your results. If negative you may return to work immediately. If positive you will need to stay home and self isolate for 2 week starting today (cleared: 11/22/2019).

## 2019-11-07 NOTE — ED Notes (Signed)
Patient transported to CT 

## 2019-11-07 NOTE — ED Notes (Signed)
Pt d/c home per MD order. Discharge summary reviewed, pt verbalizes understanding. Ambulatory. Reports ride home.

## 2019-11-07 NOTE — ED Triage Notes (Signed)
C/o R sided abd pain, diarrhea, nausea, and SOB x 3 days.  Denies vomiting and fever.

## 2019-11-07 NOTE — ED Provider Notes (Signed)
[name]   CSN: [id number] Arrival date & time: 11/07/19  1136     History Chief Complaint  Patient presents with  . Abdominal Pain  . Diarrhea    Douglas Guzman is a 38 y.o. male with PMHx COPD and tobacco dependence who presents to the ED today complaining of gradual onset, constant, sharp, RLQ abdominal pain x 2 days. She also complains of diarrhea and shortness of breath. He states that he is normally always short of breath with his COPD but states it has been worsened recently with his abdominal pain. He states he has been using his inhalers at home with mild relief. Denies recent COVID-19 positive exposure. He is abdominal surgeries include inguinal hernia repair in childhood. She has not been taking anything for his symptoms but states he got so severe today which prompted his ED visit. States he has felt chilly but denies fevers. Denies nausea, vomiting, urinary symptoms, chest pain, cough, any other associated symptoms.       Past Medical History:  Diagnosis Date  . COPD (chronic obstructive pulmonary disease) (HCC)   . Tobacco dependence     Patient Active Problem List   Diagnosis Date Noted  . DOE (dyspnea on exertion) 09/02/2018  . COPD GOLD II/ still smoking  09/02/2018  . Cigarette smoker 09/02/2018    Past Surgical History:  Procedure Laterality Date  . BRAIN SURGERY    . INGUINAL HERNIA REPAIR     occurred in childhood       Family History  Problem Relation Age of Onset  . Diabetes Mother   . COPD Mother   . CAD Father   . Throat cancer Father     Social History   Tobacco Use  . Smoking status: Current Every Day Smoker    Packs/day: 2.00    Years: 26.00    Pack years: 52.00    Types: Cigarettes  . Smokeless tobacco: Never Used  Substance Use Topics  . Alcohol use: Yes  . Drug use: Yes    Types: Marijuana    Home Medications Prior to Admission medications   Medication Sig Start  Date End Date Taking? Authorizing Provider  albuterol (PROVENTIL HFA;VENTOLIN HFA) 108 (90 Base) MCG/ACT inhaler Inhale 2 puffs into the lungs every 6 (six) hours as needed for wheezing or shortness of breath. 01/26/19   [name], [name], MD  budesonide-formoterol (SYMBICORT) 160-4.5 MCG/ACT inhaler Inhale 2 puffs into the lungs 2 (two) times daily. 09/02/18   Nyoka CowdenWert, Michael B, MD  ciprofloxacin (CIPRO) 500 MG tablet Take 1 tablet (500 mg total) by mouth 2 (two) times daily for 7 days. 11/07/19 11/14/19  Tanda RockersVenter, Marquize Seib, PA-C  cyclobenzaprine (FLEXERIL) 10 MG tablet Take 1 tablet (10 mg total) by mouth 2 (two) times daily as needed for muscle spasms. 10/15/19   Robinson, [place] N, PA-C  diclofenac sodium (VOLTAREN) 1 % GEL Apply 4 g topically 4 (four) times daily. 12/22/18   Melene PlanFloyd, Dan, DO  doxycycline (VIBRA-TABS) 100 MG tablet Take 1 tablet (100 mg total) by mouth 2 (two) times daily. 01/26/19   [name], [name], MD  ibuprofen (ADVIL) 600 MG tablet Take 1 tablet (600 mg total) by mouth every 8 (eight) hours as needed for moderate pain. Take after eating 07/19/19   Terrilee FilesButler, Michael C, MD  metroNIDAZOLE (FLAGYL) 500 MG tablet Take 1 tablet (500 mg total) by mouth 2 (two) times daily. 11/07/19   Hyman HopesVenter, Valdemar Mcclenahan, PA-C  naproxen (NAPROSYN) 500  MG tablet Take 1 tablet (500 mg total) by mouth 2 (two) times daily with a meal. 10/15/19   Robinson, [place] N, PA-C  omeprazole (PRILOSEC) 40 MG capsule Take 30- 60 min before your first and last meals of the day 09/02/18   [name], MD  predniSONE (STERAPRED UNI-PAK 21 TAB) 10 MG (21) TBPK tablet 6 tabs for 1 day, then 5 tabs for 1 das, then 4 tabs for 1 day, then 3 tabs for 1 day, 2 tabs for 1 day, then 1 tab for 1 day 07/20/19   Dahlia Byes A, NP  traMADol (ULTRAM) 50 MG tablet Take 1 tablet (50 mg total) by mouth every 6 (six) hours as needed. 07/20/19   [name], NP    Allergies    Patient has no known allergies.  Review of Systems   Review of Systems    Constitutional: Positive for chills. Negative for fever.  HENT: Negative for congestion.   Eyes: Negative for visual disturbance.  Respiratory: Positive for shortness of breath. Negative for cough.   Cardiovascular: Negative for chest pain.  Gastrointestinal: Positive for abdominal pain and diarrhea. Negative for blood in stool, nausea and vomiting.  Genitourinary: Negative for difficulty urinating, frequency, penile pain, scrotal swelling and testicular pain.  Musculoskeletal: Negative for myalgias.  Skin: Negative for rash.  Neurological: Negative for headaches.    Physical Exam Updated Vital Signs BP (!) 129/92 (BP Location: Right Arm)   Pulse 100   Temp 98 F (36.7 C) (Oral)   Resp 17   SpO2 95%   Physical Exam Vitals and nursing note reviewed.  Constitutional:      Appearance: He is not ill-appearing or diaphoretic.     Comments: Appears older than stated age  HENT:     Head: Normocephalic and atraumatic.  Eyes:     Conjunctiva/sclera: Conjunctivae normal.  Cardiovascular:     Rate and Rhythm: Regular rhythm. Tachycardia present.  Pulmonary:     Effort: Pulmonary effort is normal.     Breath sounds: Normal breath sounds. No wheezing, rhonchi or rales.  Abdominal:     Palpations: Abdomen is soft.     Tenderness: There is abdominal tenderness in the right lower quadrant, suprapubic area and left lower quadrant. There is no right CVA tenderness, left CVA tenderness, guarding or rebound. Positive signs include Rovsing's sign and McBurney's sign. Negative signs include psoas sign and obturator sign.  Musculoskeletal:     Cervical back: Neck supple.  Skin:    General: Skin is warm and dry.  Neurological:     Mental Status: He is alert.     ED Results / Procedures / Treatments   Labs (all labs ordered are listed, but only abnormal results are displayed) Labs Reviewed  COMPREHENSIVE METABOLIC PANEL - Abnormal; Notable for the following components:      Result Value    Glucose, Bld 131 (*)    Creatinine, Ser 1.31 (*)    All other components within normal limits  CBC - Abnormal; Notable for the following components:   WBC 11.4 (*)    RBC 5.95 (*)    Hemoglobin 18.8 (*)    HCT 53.7 (*)    Platelets 137 (*)    All other components within normal limits  NOVEL CORONAVIRUS, NAA (HOSP ORDER, SEND-OUT TO REF LAB; TAT 18-24 HRS)  LIPASE, BLOOD  LACTIC ACID, PLASMA  POC SARS CORONAVIRUS 2 AG -  ED    EKG EKG Interpretation  Date/Time:  Saturday November 07 2019 11:51:38 EST Ventricular Rate:  110 PR Interval:  140 QRS Duration: 92 QT Interval:  336 QTC Calculation: 454 R Axis:   -171 Text Interpretation: Sinus tachycardia Right atrial enlargement Right superior axis deviation Pulmonary disease pattern Minimal voltage criteria for LVH, may be normal variant ( Cornell product ) Abnormal ECG Since last tracing rate faster Confirmed by Jacalyn Lefevre [id number]) on 11/07/2019 2:43:03 PM   Radiology CT Abdomen Pelvis W Contrast  Result Date: 11/07/2019 CLINICAL DATA:  Right lower quadrant abdominal pain. Diarrhea and nausea. EXAM: CT ABDOMEN AND PELVIS WITH CONTRAST TECHNIQUE: Multidetector CT imaging of the abdomen and pelvis was performed using the standard protocol following bolus administration of intravenous contrast. CONTRAST:  OMNIPAQUE IOHEXOL 300 MG/ML  SOLN COMPARISON:  CT scan dated 05/10/2005 FINDINGS: Lower chest: Normal. Hepatobiliary: No focal liver abnormality is seen. No gallstones, gallbladder wall thickening, or biliary dilatation. Pancreas: Unremarkable. No pancreatic ductal dilatation or surrounding inflammatory changes. Spleen: Normal in size without focal abnormality. Adrenals/Urinary Tract: Adrenal glands are unremarkable. Kidneys are normal, without renal calculi, focal lesion, or hydronephrosis. Bladder is unremarkable. Stomach/Bowel: There is mucosal edema throughout the entire colon. Stomach and small bowel appear normal.  Appendix is normal. Terminal ileum is normal. Vascular/Lymphatic: Fairly extensive aortic atherosclerosis for a patient of this age. No enlarged abdominal or pelvic lymph nodes. Reproductive: Prostate is unremarkable. Other: No abdominal wall hernia or abnormality. No abdominopelvic ascites. Musculoskeletal: No acute or significant osseous findings. IMPRESSION: Diffuse mucosal edema throughout the entire colon consistent with colitis. Electronically Signed   By: Francene Boyers M.D.   On: 11/07/2019 15:07   DG Chest Port 1 View  Result Date: 11/07/2019 CLINICAL DATA:  C/o R sided abd pain, diarrhea, nausea, and SOB x 3 days. Denies vomiting and fever EXAM: PORTABLE CHEST 1 VIEW COMPARISON:  Chest radiograph 07/19/2019 FINDINGS: The heart size and mediastinal contours are within normal limits. The lungs are clear. No pneumothorax or pleural effusion. The visualized skeletal structures are unremarkable. IMPRESSION: No acute cardiopulmonary process. Electronically Signed   By: Emmaline Kluver M.D.   On: 11/07/2019 13:05    Procedures Procedures (including critical care time)  Medications Ordered in ED Medications  sodium chloride flush (NS) 0.9 % injection 3 mL (3 mLs Intravenous Given 11/07/19 1226)  sodium chloride 0.9 % bolus 1,000 mL (0 mLs Intravenous Stopped 11/07/19 1400)  morphine 4 MG/ML injection 4 mg (4 mg Intravenous Given 11/07/19 1255)  iohexol (OMNIPAQUE) 300 MG/ML solution 100 mL (100 mLs Intravenous Contrast Given 11/07/19 1457)    ED Course  I have reviewed the triage vital signs and the nursing notes.  Pertinent labs & imaging results that were available during my care of the patient were reviewed by me and considered in my medical decision making (see chart for details).  38 year old male who presents the ED today complaining of right lower quadrant abdominal pain and diarrhea for the past several days.  Also complains of shortness of breath although suspect this is likely  more due to pain than anything else.  Patient is satting 95% on room air without any accessory muscle use.  He was mildly tachycardic on arrival although this has improved with pain medication and fluids.  Shortness of breath has improved as well; doubt pulmonary etiology today including PE. Is diffusely tender in the right lower quadrant but also in the suprapubic area and the left lower quadrant.  Will obtain screening labs at this time.  Will swab  for Covid given GI symptoms and shortness of breath with rapid test.  Pain CT abdomen and pelvis with concern for appendicitis versus colitis. Morphine given for pain.   CBC with leukocytosis mildly elevated at 11,000 this does appear to be patient's baseline.  Hemoglobin and hematocrit elevated at 18/53.  Question dehydration.  Creatinine mildly elevated at 1.31.  Patient already receiving fluids.  Patient's baseline around 1.1.  Lipase within normal limits.  Initial lactic acid 1.7.   X-ray negative.  EKG without ischemic changes.  Rapid covid negative.  CT abdomen and pelvis with colitis diffusely.  No signs of appendicitis.  Patient's vitals have significantly improved in the ED.  His heart rate is now in the 60s.  He states his pain has improved with morphine.  Again without nausea or vomiting.  Feel that he is stable for discharge with close PCP follow-up.  He does not appear volume depleted at this time.  Will prescribe Flagyl and Cipro for colitis and have patient follow-up with PCP.  Will reswab for Covid sent out.  Patient advised that he needs to self isolate until he receives his Covid results.  If negative he may return to work.  If positive he will need to self isolate for 14 days starting today.  Patient is in agreement with plan at this time.  All questions have been answered appropriately.  He is stable for discharge.   This note was prepared using Dragon voice recognition software and may include unintentional dictation errors due to the  inherent limitations of voice recognition software.  Douglas Guzman was evaluated in Emergency Department on 11/07/2019 for the symptoms described in the history of present illness. He was evaluated in the context of the global COVID-19 pandemic, which necessitated consideration that the patient might be at risk for infection with the SARS-CoV-2 virus that causes COVID-19. Institutional protocols and algorithms that pertain to the evaluation of patients at risk for COVID-19 are in a state of rapid change based on information released by regulatory bodies including the CDC and federal and state organizations. These policies and algorithms were followed during the patient's care in the ED.      MDM Rules/Calculators/A&P   Final Clinical Impression(s) / ED Diagnoses Final diagnoses:  Colitis  Abdominal pain, unspecified abdominal location  Person under investigation for COVID-19    Rx / DC Orders ED Discharge Orders         Ordered    ciprofloxacin (CIPRO) 500 MG tablet  2 times daily     11/07/19 1534    metroNIDAZOLE (FLAGYL) 500 MG tablet  2 times daily     11/07/19 1534           Eustaquio Maize, PA-C 11/07/19 1537    Isla Pence, MD 11/08/19 [phone]

## 2019-11-08 LAB — NOVEL CORONAVIRUS, NAA (HOSP ORDER, SEND-OUT TO REF LAB; TAT 18-24 HRS): SARS-CoV-2, NAA: NOT DETECTED

## 2020-04-02 ENCOUNTER — Other Ambulatory Visit: Payer: Self-pay

## 2020-04-02 ENCOUNTER — Ambulatory Visit (HOSPITAL_COMMUNITY)
Admission: EM | Admit: 2020-04-02 | Discharge: 2020-04-02 | Disposition: A | Discharge status: Home / Self Care | Payer: Self-pay | Attending: Family Medicine | Admitting: Family Medicine

## 2020-04-02 ENCOUNTER — Encounter (HOSPITAL_COMMUNITY): Payer: Self-pay

## 2020-04-02 ENCOUNTER — Ambulatory Visit (INDEPENDENT_AMBULATORY_CARE_PROVIDER_SITE_OTHER): Payer: Self-pay

## 2020-04-02 DIAGNOSIS — R0789 Other chest pain: Secondary | ICD-10-CM | POA: Insufficient documentation

## 2020-04-02 DIAGNOSIS — J449 Chronic obstructive pulmonary disease, unspecified: Secondary | ICD-10-CM | POA: Insufficient documentation

## 2020-04-02 DIAGNOSIS — J441 Chronic obstructive pulmonary disease with (acute) exacerbation: Secondary | ICD-10-CM | POA: Insufficient documentation

## 2020-04-02 DIAGNOSIS — R002 Palpitations: Secondary | ICD-10-CM | POA: Insufficient documentation

## 2020-04-02 DIAGNOSIS — R079 Chest pain, unspecified: Secondary | ICD-10-CM

## 2020-04-02 DIAGNOSIS — R9431 Abnormal electrocardiogram [ECG] [EKG]: Secondary | ICD-10-CM | POA: Insufficient documentation

## 2020-04-02 DIAGNOSIS — M25512 Pain in left shoulder: Secondary | ICD-10-CM | POA: Insufficient documentation

## 2020-04-02 LAB — CBC WITH DIFFERENTIAL/PLATELET
Abs Immature Granulocytes: 0.05 10*3/uL (ref 0.00–0.07)
Basophils Absolute: 0.1 10*3/uL (ref 0.0–0.1)
Basophils Relative: 1 %
Eosinophils Absolute: 0.2 10*3/uL (ref 0.0–0.5)
Eosinophils Relative: 2 %
HCT: 50.3 % (ref 39.0–52.0)
Hemoglobin: 16.9 g/dL (ref 13.0–17.0)
Immature Granulocytes: 1 %
Lymphocytes Relative: 14 %
Lymphs Abs: 1.4 10*3/uL (ref 0.7–4.0)
MCH: 30.8 pg (ref 26.0–34.0)
MCHC: 33.6 g/dL (ref 30.0–36.0)
MCV: 91.6 fL (ref 80.0–100.0)
Monocytes Absolute: 0.7 10*3/uL (ref 0.1–1.0)
Monocytes Relative: 7 %
Neutro Abs: 7.9 10*3/uL — ABNORMAL HIGH (ref 1.7–7.7)
Neutrophils Relative %: 75 %
Platelets: 187 10*3/uL (ref 150–400)
RBC: 5.49 MIL/uL (ref 4.22–5.81)
RDW: 13.2 % (ref 11.5–15.5)
WBC: 10.4 10*3/uL (ref 4.0–10.5)
nRBC: 0 % (ref 0.0–0.2)

## 2020-04-02 MED ORDER — ALBUTEROL SULFATE HFA 108 (90 BASE) MCG/ACT IN AERS: 2.0000 | INHALATION_SPRAY | Freq: Four times a day (QID) | RESPIRATORY_TRACT | 0 refills | Status: DC | PRN

## 2020-04-02 MED ORDER — MELOXICAM 15 MG PO TABS: 15.0000 mg | ORAL_TABLET | Freq: Every day | ORAL | 0 refills | Status: DC

## 2020-04-02 MED ORDER — TIZANIDINE HCL 4 MG PO TABS: 4.0000 mg | ORAL_TABLET | Freq: Three times a day (TID) | ORAL | 0 refills | Status: DC | PRN

## 2020-04-02 NOTE — ED Provider Notes (Signed)
[facility]   MRN: [id number] DOB: December 04, 1980  Subjective:   Douglas Guzman is a 39 y.o. male presenting for 2-week history of persistent intermittent and recurrent midsternal chest pain that radiates across to the left side including the left shoulder.  Patient states that he has intermittent shortness of breath but is very typical for him for his COPD.  He uses Symbicort and albuterol but needs a refill on his albuterol.  Still smokes.  Denies IV drug use.  Denies fever, body aches, nausea, vomiting.  He does admit that he has had intermittent night sweats as well.  No current facility-administered medications for this encounter.  Current Outpatient Medications:  .  albuterol (PROVENTIL HFA;VENTOLIN HFA) 108 (90 Base) MCG/ACT inhaler, Inhale 2 puffs into the lungs every 6 (six) hours as needed for wheezing or shortness of breath., Disp: 1 Inhaler, Rfl: 2 .  budesonide-formoterol (SYMBICORT) 160-4.5 MCG/ACT inhaler, Inhale 2 puffs into the lungs 2 (two) times daily., Disp: 1 Inhaler, Rfl: 11 .  cyclobenzaprine (FLEXERIL) 10 MG tablet, Take 1 tablet (10 mg total) by mouth 2 (two) times daily as needed for muscle spasms., Disp: 20 tablet, Rfl: 0 .  diclofenac sodium (VOLTAREN) 1 % GEL, Apply 4 g topically 4 (four) times daily., Disp: 100 g, Rfl: 0 .  omeprazole (PRILOSEC) 40 MG capsule, Take 30- 60 min before your first and last meals of the day, Disp: , Rfl:    No Known Allergies  Past Medical History:  Diagnosis Date  . COPD (chronic obstructive pulmonary disease) ([place])   . Tobacco dependence      Past Surgical History:  Procedure Laterality Date  . BRAIN SURGERY    . INGUINAL HERNIA REPAIR     occurred in childhood    Family History  Problem Relation Age of Onset  . Diabetes Mother   . COPD Mother   . CAD Father   . Throat cancer Father     Social History   Tobacco Use  . Smoking status: Current Every Day Smoker    Packs/day: 2.00    Years: 26.00    Pack  years: 52.00    Types: Cigarettes  . Smokeless tobacco: Never Used  Substance Use Topics  . Alcohol use: Yes    Comment: occ  . Drug use: Yes    Types: Marijuana    ROS   Objective:   Vitals: BP 131/75   Pulse 70   Temp 98 F (36.7 C) (Oral)   Resp 18   Ht 5\' 8"  (1.727 m)   Wt 140 lb (63.5 kg)   SpO2 99%   BMI 21.29 kg/m   Physical Exam Constitutional:      General: He is not in acute distress.    Appearance: Normal appearance. He is well-developed. He is ill-appearing (Cachectic, chronically ill-appearing). He is not toxic-appearing or diaphoretic.  HENT:     Head: Normocephalic and atraumatic.     Right Ear: External ear normal.     Left Ear: External ear normal.     Nose: Nose normal.     Mouth/Throat:     Mouth: Mucous membranes are moist.     Pharynx: Oropharynx is clear.  Eyes:     General: No scleral icterus.       Right eye: No discharge.        Left eye: No discharge.     Extraocular Movements: Extraocular movements intact.     Pupils: Pupils are equal, round, and reactive  to light.  Cardiovascular:     Rate and Rhythm: Normal rate and regular rhythm.     Heart sounds: Normal heart sounds. No murmur. No friction rub. No gallop.   Pulmonary:     Effort: Pulmonary effort is normal. No respiratory distress.     Breath sounds: No stridor. No wheezing, rhonchi or rales.     Comments: No use of accessory muscles, pursed lips.  Decreased lung sounds in lower fields. Chest:     Chest wall: Tenderness present. No mass, lacerations, deformity, swelling, crepitus or edema. There is no dullness to percussion.    Neurological:     Mental Status: He is alert and oriented to person, place, and time.  Psychiatric:        Mood and Affect: Mood normal.        Behavior: Behavior normal.        Thought Content: Thought content normal.        Judgment: Judgment normal.     DG Chest 2 View  Result Date: 04/02/2020 CLINICAL DATA:  Chest pain EXAM: CHEST - 2 VIEW  COMPARISON:  November 07, 2019 FINDINGS: Lungs are clear. Heart size and pulmonary vascularity are normal. No adenopathy. No bone lesions. No pneumothorax. IMPRESSION: Lungs clear.  Cardiac silhouette within normal limits. Electronically Signed   By: Bretta Bang III M.D.   On: 04/02/2020 12:20    ED ECG REPORT   Date: 04/02/2020  Rate: 70bpm  Rhythm: normal sinus rhythm  QRS Axis: normal  Intervals: normal  ST/T Wave abnormalities: normal  Conduction Disutrbances:right bundle branch block  Narrative Interpretation: Possible incomplete right bundle branch block, otherwise sinus rhythm at 70 bpm.  Largely unchanged from previous EKGs.  Old EKG Reviewed: unchanged  I have personally reviewed the EKG tracing and agree with the computerized printout as noted.   Assessment and Plan :   PDMP not reviewed this encounter.  1. Atypical chest pain   2. Pain radiating to left shoulder   3. Heart palpitations   4. Chronic obstructive pulmonary disease, unspecified COPD type (HCC)   5. COPD with exacerbation (HCC)   6. Nonspecific abnormal electrocardiogram (ECG) (EKG)   7. Chest wall pain     Will manage for chest wall pain with meloxicam, tizanidine.  Refilled his albuterol inhaler.  Recommended close follow-up with his pulmonologist given his COPD, ongoing smoking and shortness of breath.  Low suspicion for COPD exacerbation, chest PE given reproducible chest wall pain, stable vital signs.  Counseled patient on potential for adverse effects with medications prescribed/recommended today, ER and return-to-clinic precautions discussed, patient verbalized understanding.    Wallis Bamberg, PA-C 04/02/20 1230

## 2020-04-02 NOTE — ED Triage Notes (Signed)
Pt c/o 7/10 midsternal throbbing chest pain that radiates to left shoulder, heart flutteringx2 wks. Pt also c/o SOB and right leg weakness. Pt has non labored breathing. Skin color WNL.

## 2020-05-06 ENCOUNTER — Encounter (HOSPITAL_COMMUNITY): Payer: Self-pay

## 2020-05-06 ENCOUNTER — Ambulatory Visit (INDEPENDENT_AMBULATORY_CARE_PROVIDER_SITE_OTHER): Payer: Self-pay

## 2020-05-06 ENCOUNTER — Ambulatory Visit (HOSPITAL_COMMUNITY)
Admission: EM | Admit: 2020-05-06 | Discharge: 2020-05-06 | Disposition: A | Discharge status: Home / Self Care | Payer: Self-pay | Attending: Internal Medicine | Admitting: Internal Medicine

## 2020-05-06 ENCOUNTER — Other Ambulatory Visit: Payer: Self-pay

## 2020-05-06 DIAGNOSIS — M79641 Pain in right hand: Secondary | ICD-10-CM

## 2020-05-06 DIAGNOSIS — S62326A Displaced fracture of shaft of fifth metacarpal bone, right hand, initial encounter for closed fracture: Secondary | ICD-10-CM

## 2020-05-06 MED ORDER — KETOROLAC TROMETHAMINE 30 MG/ML IJ SOLN
30.0000 mg | Freq: Once | INTRAMUSCULAR | Status: AC
  Administered 2020-05-06: 30 mg via INTRAMUSCULAR

## 2020-05-06 MED ORDER — KETOROLAC TROMETHAMINE 30 MG/ML IJ SOLN
INTRAMUSCULAR | Status: AC
  Filled 2020-05-06: qty 1

## 2020-05-06 MED ORDER — IBUPROFEN 600 MG PO TABS: 600.0000 mg | ORAL_TABLET | Freq: Four times a day (QID) | ORAL | 0 refills | Status: DC | PRN

## 2020-05-06 NOTE — ED Provider Notes (Signed)
[facility]    CSN: [id number] Arrival date & time: 05/06/20  5638      History   Chief Complaint Chief Complaint  Patient presents with  . Hand Injury    HPI Douglas Guzman is a 39 y.o. male comes to urgent care with right hand pain which started last night after he punched a door.  Patient has pain of 9 out of 10.  Pain is throbbing, severe, has not tried any over-the-counter pain medications, no relieving factors.  Patient had swelling of the right hand with slight numbness of the right fifth digit.  Patient is able to make a fist with significant exacerbation of the pain.   HPI  Past Medical History:  Diagnosis Date  . COPD (chronic obstructive pulmonary disease) ([place])   . Tobacco dependence     Patient Active Problem List   Diagnosis Date Noted  . DOE (dyspnea on exertion) 09/02/2018  . COPD GOLD II/ still smoking  09/02/2018  . Cigarette smoker 09/02/2018    Past Surgical History:  Procedure Laterality Date  . BRAIN SURGERY    . INGUINAL HERNIA REPAIR     occurred in childhood       Home Medications    Prior to Admission medications   Medication Sig Start Date End Date Taking? Authorizing Provider  albuterol (VENTOLIN HFA) 108 (90 Base) MCG/ACT inhaler Inhale 2 puffs into the lungs every 6 (six) hours as needed for wheezing or shortness of breath. 04/02/20   Jaynee Eagles, PA-C  budesonide-formoterol [facility]) 160-4.5 MCG/ACT inhaler Inhale 2 puffs into the lungs 2 (two) times daily. 09/02/18   Tanda Rockers, MD  cyclobenzaprine (FLEXERIL) 10 MG tablet Take 1 tablet (10 mg total) by mouth 2 (two) times daily as needed for muscle spasms. 10/15/19   Robinson, [place] N, PA-C  diclofenac sodium (VOLTAREN) 1 % GEL Apply 4 g topically 4 (four) times daily. 12/22/18   Deno Etienne, DO  ibuprofen (ADVIL) 600 MG tablet Take 1 tablet (600 mg total) by mouth every 6 (six) hours as needed. 05/06/20   Sherly Brodbeck, Myrene Galas, MD  meloxicam (MOBIC) 15 MG tablet Take 1  tablet (15 mg total) by mouth daily. 04/02/20   Jaynee Eagles, PA-C  omeprazole (PRILOSEC) 40 MG capsule Take 30- 60 min before your first and last meals of the day 09/02/18   Tanda Rockers, MD  tiZANidine (ZANAFLEX) 4 MG tablet Take 1 tablet (4 mg total) by mouth every 8 (eight) hours as needed. 04/02/20   Jaynee Eagles, PA-C    Family History Family History  Problem Relation Age of Onset  . Diabetes Mother   . COPD Mother   . CAD Father   . Throat cancer Father     Social History Social History   Tobacco Use  . Smoking status: Current Every Day Smoker    Packs/day: 2.00    Years: 26.00    Pack years: 52.00    Types: Cigarettes  . Smokeless tobacco: Never Used  Vaping Use  . Vaping Use: Former  Substance Use Topics  . Alcohol use: Yes    Comment: occ  . Drug use: Yes    Types: Marijuana     Allergies   Patient has no known allergies.   Review of Systems Review of Systems  Constitutional: Positive for activity change.  Gastrointestinal: Negative.   Musculoskeletal: Positive for arthralgias, joint swelling and myalgias.  Neurological: Negative.      Physical Exam Triage Vital  Signs ED Triage Vitals  Enc Vitals Group     BP 05/06/20 1931 115/68     Pulse Rate 05/06/20 1931 67     Resp 05/06/20 1931 18     Temp 05/06/20 1931 98.9 F (37.2 C)     Temp Source 05/06/20 1931 Oral     SpO2 05/06/20 1931 97 %     Weight 05/06/20 1933 140 lb (63.5 kg)     Height 05/06/20 1933 5\' 7"  (1.702 m)     Head Circumference --      Peak Flow --      Pain Score 05/06/20 1933 9     Pain Loc --      Pain Edu? --      Excl. in GC? --    No data found.  Updated Vital Signs BP 115/68   Pulse 67   Temp 98.9 F (37.2 C) (Oral)   Resp 18   Ht 5\' 7"  (1.702 m)   Wt 63.5 kg   SpO2 97%   BMI 21.93 kg/m   Visual Acuity Right Eye Distance:   Left Eye Distance:   Bilateral Distance:    Right Eye Near:   Left Eye Near:    Bilateral Near:     Physical Exam Vitals and  nursing note reviewed.  Constitutional:      Appearance: Normal appearance.  Cardiovascular:     Rate and Rhythm: Normal rate and regular rhythm.     Pulses: Normal pulses.     Heart sounds: Normal heart sounds.  Pulmonary:     Effort: Pulmonary effort is normal.     Breath sounds: Normal breath sounds.  Musculoskeletal:        General: Swelling, tenderness and signs of injury present.     Comments: Limited range of motion of the fifth digit.  Cap refill is less than 2 seconds.  Neurological:     Mental Status: He is alert.      UC Treatments / Results  Labs (all labs ordered are listed, but only abnormal results are displayed) Labs Reviewed - No data to display  EKG   Radiology DG Hand Complete Right  Result Date: 05/06/2020 CLINICAL DATA:  Right hand pain after punching a door last night. EXAM: RIGHT HAND - COMPLETE 3+ VIEW COMPARISON:  Right hand x-rays dated June 1 28. FINDINGS: Acute fracture of the distal fifth metacarpal shaft with volar angulation. No additional fracture. No dislocation. Joint spaces are preserved. Bone mineralization is normal. Dorsal and lateral hand soft tissue swelling. IMPRESSION: 1. Acute angulated fracture of the distal fifth metacarpal shaft. Electronically Signed   By: 07/06/2020 M.D.   On: 05/06/2020 20:00    Procedures Procedures (including critical care time)  Medications Ordered in UC Medications  ketorolac (TORADOL) 30 MG/ML injection 30 mg (30 mg Intramuscular Given 05/06/20 2025)    Initial Impression / Assessment and Plan / UC Course  I have reviewed the triage vital signs and the nursing notes.  Pertinent labs & imaging results that were available during my care of the patient were reviewed by me and considered in my medical decision making (see chart for details).     1.  Angulated fracture of the fifth metacarpal bone: Toradol 30 mg IM x1 dose Ibuprofen 600 mg every 6 hours as needed for pain Volar splint  placed Patient will follow up with orthopedic surgery in about 3 to 5 days I spoke with the hand surgeon on-call Return precautions  given. If patient notices any worsening pain, change in color of the fingers or persistent numbness and tingling patient is advised to call the orthopedic surgeons office sooner. Final Clinical Impressions(s) / UC Diagnoses   Final diagnoses:  Closed displaced fracture of shaft of fifth metacarpal bone of right hand, initial encounter   Discharge Instructions   None    ED Prescriptions    Medication Sig Dispense Auth. Provider   ibuprofen (ADVIL) 600 MG tablet Take 1 tablet (600 mg total) by mouth every 6 (six) hours as needed. 30 tablet Reese Stockman, Britta Mccreedy, MD     I have reviewed the PDMP during this encounter.   Merrilee Jansky, MD 05/06/20 2056

## 2020-05-06 NOTE — Progress Notes (Signed)
Orthopedic Tech Progress Note Patient Details:  Douglas Guzman 08-22-1981 [id number]  Ortho Devices Type of Ortho Device: Volar splint Ortho Device/Splint Location: Upper Right Extremity Ortho Device/Splint Interventions: Ordered, Application   Post Interventions Patient Tolerated: Well Instructions Provided: Adjustment of device, Care of device, Poper ambulation with device   [name] P Harle Stanford 05/06/2020, 8:49 PM

## 2020-05-06 NOTE — ED Triage Notes (Signed)
Pt states he hit a door with his right hand last night and woke up today and his right hand was swollen. Pt c/o 9/10 throbbing pain in right hand. Pt has 3+ edema of right hand, pt unable to make fist with hand.

## 2020-09-02 ENCOUNTER — Ambulatory Visit: Payer: Self-pay | Attending: Family Medicine | Admitting: Family Medicine

## 2020-09-02 ENCOUNTER — Ambulatory Visit (HOSPITAL_COMMUNITY)
Admission: RE | Admit: 2020-09-02 | Discharge: 2020-09-02 | Disposition: A | Discharge status: Home / Self Care | Payer: Medicaid Other | Source: Ambulatory Visit | Attending: Family Medicine | Admitting: Family Medicine

## 2020-09-02 ENCOUNTER — Other Ambulatory Visit: Payer: Self-pay

## 2020-09-02 ENCOUNTER — Other Ambulatory Visit: Payer: Self-pay | Admitting: Family Medicine

## 2020-09-02 ENCOUNTER — Encounter: Payer: Self-pay | Admitting: Family Medicine

## 2020-09-02 VITALS — BP 128/71 | HR 71 | Ht 68.0 in | Wt 137.0 lb

## 2020-09-02 DIAGNOSIS — J441 Chronic obstructive pulmonary disease with (acute) exacerbation: Secondary | ICD-10-CM

## 2020-09-02 DIAGNOSIS — R072 Precordial pain: Secondary | ICD-10-CM

## 2020-09-02 DIAGNOSIS — Z789 Other specified health status: Secondary | ICD-10-CM

## 2020-09-02 DIAGNOSIS — J069 Acute upper respiratory infection, unspecified: Secondary | ICD-10-CM

## 2020-09-02 DIAGNOSIS — R1013 Epigastric pain: Secondary | ICD-10-CM

## 2020-09-02 DIAGNOSIS — F17200 Nicotine dependence, unspecified, uncomplicated: Secondary | ICD-10-CM

## 2020-09-02 DIAGNOSIS — F419 Anxiety disorder, unspecified: Secondary | ICD-10-CM

## 2020-09-02 DIAGNOSIS — F32A Depression, unspecified: Secondary | ICD-10-CM

## 2020-09-02 MED ORDER — BUDESONIDE-FORMOTEROL FUMARATE 160-4.5 MCG/ACT IN AERO: 2.0000 | INHALATION_SPRAY | Freq: Two times a day (BID) | RESPIRATORY_TRACT | 11 refills | Status: DC

## 2020-09-02 MED ORDER — CETIRIZINE HCL 10 MG PO TABS: 10.0000 mg | ORAL_TABLET | Freq: Every day | ORAL | 11 refills | Status: DC

## 2020-09-02 MED ORDER — ALBUTEROL SULFATE HFA 108 (90 BASE) MCG/ACT IN AERS: 2.0000 | INHALATION_SPRAY | Freq: Four times a day (QID) | RESPIRATORY_TRACT | 11 refills | Status: DC | PRN

## 2020-09-02 MED ORDER — PREDNISONE 20 MG PO TABS: ORAL_TABLET | ORAL | 0 refills | Status: DC

## 2020-09-02 MED ORDER — OMEPRAZOLE 40 MG PO CPDR: DELAYED_RELEASE_CAPSULE | ORAL | 1 refills | Status: DC

## 2020-09-02 MED ORDER — SERTRALINE HCL 50 MG PO TABS: 50.0000 mg | ORAL_TABLET | Freq: Every day | ORAL | 1 refills | Status: DC

## 2020-09-02 MED ORDER — DOXYCYCLINE HYCLATE 100 MG PO TABS: 100.0000 mg | ORAL_TABLET | Freq: Two times a day (BID) | ORAL | 0 refills | Status: DC

## 2020-09-02 MED FILL — DOXYCYCLINE HYCLATE 100 MG: 100 | 10 days supply | Qty: 20 | Fill #0

## 2020-09-02 MED FILL — SERTRALINE HCL 50 MG TABLET: 50 | 30 days supply | Qty: 30 | Fill #0

## 2020-09-02 MED FILL — SYMBICORT 160-4.5 MCG INH: 160-4.5 | 30 days supply | Qty: 10 | Fill #0

## 2020-09-02 MED FILL — ALBUTEROL SULFATE HFA 108 (: 108 (90 BAS | 25 days supply | Qty: 18 | Fill #0

## 2020-09-02 MED FILL — OMEPRAZOLE DR 40 MG CAPSULE: 40 | 30 days supply | Qty: 60 | Fill #0

## 2020-09-02 MED FILL — predniSONE 20 MG TABS: 20 | 5 days supply | Qty: 10 | Fill #0

## 2020-09-02 NOTE — Progress Notes (Signed)
Established Patient Office Visit  Subjective:  Patient ID: Douglas Guzman, male    DOB: [birth year]  Age: 39 y.o. MRN: [id number]  CC:  Chief Complaint  Patient presents with  . COPD    HPI Douglas Guzman, 39 year old male with history of COPD, who presents secondary to complaint of possible respiratory infection as he has had a recent increase in cough which is productive of white-yellow mucus.  He also reports that he has been having pain/burning sensation in the middle of his chest beneath the breastbone.  He does endorse some sensation of backwash of bad tasting liquid into the mouth and throat, especially when lying down.  He also feels that he is more anxious.  He states that he had told the nurses at his workplace about a substernal chest pain and they told him that he needed to schedule an appointment with his primary care provider for further evaluation.  He has felt as if he has had occasional chest pain but this has been both on the left and right and is worse with coughing.  He has had some mild episodes of nausea after eating.  He would be willing to talk with the social worker about his recent increase in anxiety/nervousness.  He does feel that he could possibly be depressed.  He denies any suicidal thoughts or ideas.  He reports that he has difficulty sleeping because he feels that he is constantly worrying.       He denies any fever but has felt cold at times.  He has felt more fatigued.  He has had no constipation or diarrhea, no blood in the stool or black stools.  He did have some stools about a month ago which were dark in color.  He does continue to smoke.  He states that when he is at work he does not really think about smoking however when he is home, he smokes more.  Past Medical History:  Diagnosis Date  . COPD (chronic obstructive pulmonary disease) (HCC)   . Tobacco dependence     Past Surgical History:  Procedure Laterality Date  . BRAIN SURGERY    . INGUINAL  HERNIA REPAIR     occurred in childhood    Family History  Problem Relation Age of Onset  . Diabetes Mother   . COPD Mother   . CAD Father   . Throat cancer Father     Social History   Socioeconomic History  . Marital status: Single    Spouse name: Not on file  . Number of children: Not on file  . Years of education: Not on file  . Highest education level: Not on file  Occupational History  . Not on file  Tobacco Use  . Smoking status: Current Every Day Smoker    Packs/day: 2.00    Years: 26.00    Pack years: 52.00    Types: Cigarettes  . Smokeless tobacco: Never Used  Vaping Use  . Vaping Use: Former  Substance and Sexual Activity  . Alcohol use: Yes    Comment: occ  . Drug use: Yes    Types: Marijuana  . Sexual activity: Yes  Other Topics Concern  . Not on file  Social History Narrative  . Not on file   Social Determinants of Health   Financial Resource Strain:   . Difficulty of Paying Living Expenses: Not on file  Food Insecurity:   . Worried About Programme researcher, broadcasting/film/video in the Last Year:  Not on file  . Ran Out of Food in the Last Year: Not on file  Transportation Needs:   . Lack of Transportation (Medical): Not on file  . Lack of Transportation (Non-Medical): Not on file  Physical Activity:   . Days of Exercise per Week: Not on file  . Minutes of Exercise per Session: Not on file  Stress:   . Feeling of Stress : Not on file  Social Connections:   . Frequency of Communication with Friends and Family: Not on file  . Frequency of Social Gatherings with Friends and Family: Not on file  . Attends Religious Services: Not on file  . Active Member of Clubs or Organizations: Not on file  . Attends Banker Meetings: Not on file  . Marital Status: Not on file  Intimate Partner Violence:   . Fear of Current or Ex-Partner: Not on file  . Emotionally Abused: Not on file  . Physically Abused: Not on file  . Sexually Abused: Not on file     Outpatient Medications Prior to Visit  Medication Sig Dispense Refill  . albuterol (VENTOLIN HFA) 108 (90 Base) MCG/ACT inhaler Inhale 2 puffs into the lungs every 6 (six) hours as needed for wheezing or shortness of breath. 18 g 0  . budesonide-formoterol (SYMBICORT) 160-4.5 MCG/ACT inhaler Inhale 2 puffs into the lungs 2 (two) times daily. 1 Inhaler 11  . diclofenac sodium (VOLTAREN) 1 % GEL Apply 4 g topically 4 (four) times daily. 100 g 0  . ibuprofen (ADVIL) 600 MG tablet Take 1 tablet (600 mg total) by mouth every 6 (six) hours as needed. 30 tablet 0  . meloxicam (MOBIC) 15 MG tablet Take 1 tablet (15 mg total) by mouth daily. 30 tablet 0  . omeprazole (PRILOSEC) 40 MG capsule Take 30- 60 min before your first and last meals of the day    . tiZANidine (ZANAFLEX) 4 MG tablet Take 1 tablet (4 mg total) by mouth every 8 (eight) hours as needed. 30 tablet 0  . cyclobenzaprine (FLEXERIL) 10 MG tablet Take 1 tablet (10 mg total) by mouth 2 (two) times daily as needed for muscle spasms. (Patient not taking: Reported on 09/02/2020) 20 tablet 0   No facility-administered medications prior to visit.    No Known Allergies  ROS Review of Systems  Constitutional: Positive for chills and fatigue. Negative for fever.  HENT: Positive for congestion and postnasal drip. Negative for sore throat and trouble swallowing.        Abnormal noise in left ear for a few weeks  Respiratory: Positive for cough and shortness of breath.   Cardiovascular: Positive for chest pain (middle of chest; sometimes occurs with breathing). Negative for palpitations and leg swelling.  Gastrointestinal: Positive for abdominal pain and nausea. Negative for blood in stool, constipation and diarrhea.       Occasional dark stools  Endocrine: Negative for polydipsia, polyphagia and polyuria.  Genitourinary: Negative for dysuria and frequency.  Musculoskeletal: Positive for arthralgias and back pain.  Neurological: Positive  for dizziness and headaches.  Hematological: Negative for adenopathy. Does not bruise/bleed easily.  Psychiatric/Behavioral: Positive for sleep disturbance. The patient is nervous/anxious.       Objective:    Physical Exam Vitals and nursing note reviewed.  Constitutional:      General: He is not in acute distress.    Comments: Thin appearing male in no acute distress but who appears anxious.  Patient with slight nasal quality to his voice.  HENT:     Ears:     Comments: TMs dull bilaterally    Nose: Congestion and rhinorrhea (Clear) present.     Mouth/Throat:     Pharynx: Posterior oropharyngeal erythema present. No oropharyngeal exudate.     Comments: Posterior pharynx erythema and cobblestoning Neck:     Vascular: No carotid bruit.  Cardiovascular:     Rate and Rhythm: Normal rate and regular rhythm.  Pulmonary:     Effort: Pulmonary effort is normal.     Breath sounds: Normal breath sounds.     Comments: Mild decreased breath sounds at the lung bases, mild decrease in air movement Abdominal:     Palpations: Abdomen is soft.     Tenderness: There is abdominal tenderness (epigastric). There is no right CVA tenderness, left CVA tenderness, guarding or rebound.  Musculoskeletal:     Cervical back: Normal range of motion and neck supple. No tenderness.     Right lower leg: No edema.     Left lower leg: No edema.  Lymphadenopathy:     Cervical: No cervical adenopathy.  Skin:    General: Skin is warm and dry.  Neurological:     General: No focal deficit present.     Mental Status: He is alert and oriented to person, place, and time. Mental status is at baseline.  Psychiatric:        Behavior: Behavior normal.     Comments: Patient appears anxious     BP 128/71 (BP Location: Left Arm, Patient Position: Sitting)   Pulse 71   Ht 5\' 8"  (1.727 m)   Wt 137 lb (62.1 kg)   SpO2 97%   BMI 20.83 kg/m  Wt Readings from Last 3 Encounters:  09/02/20 137 lb (62.1 kg)  05/06/20  140 lb (63.5 kg)  04/02/20 140 lb (63.5 kg)     Health Maintenance Due  Topic Date Due  . Hepatitis C Screening  Never done  . COVID-19 Vaccine (1) Never done  . HIV Screening  Never done  . INFLUENZA VACCINE  06/26/2020      Lab Results  Component Value Date   TSH 1.450 01/13/2008   Lab Results  Component Value Date   WBC 10.4 04/02/2020   HGB 16.9 04/02/2020   HCT 50.3 04/02/2020   MCV 91.6 04/02/2020   PLT 187 04/02/2020   Lab Results  Component Value Date   NA 135 11/07/2019   K 4.0 11/07/2019   CO2 22 11/07/2019   GLUCOSE 131 (H) 11/07/2019   BUN 17 11/07/2019   CREATININE 1.31 (H) 11/07/2019   BILITOT 0.7 11/07/2019   ALKPHOS 69 11/07/2019   AST 21 11/07/2019   ALT 17 11/07/2019   PROT 7.1 11/07/2019   ALBUMIN 4.0 11/07/2019   CALCIUM 9.0 11/07/2019   ANIONGAP 11 11/07/2019   Lab Results  Component Value Date   CHOL 162 01/13/2008   Lab Results  Component Value Date   HDL 41 01/13/2008   Lab Results  Component Value Date   LDLCALC 102 (H) 01/13/2008   Lab Results  Component Value Date   TRIG 96 01/13/2008   Lab Results  Component Value Date   CHOLHDL 4.0 Ratio 01/13/2008   No results found for: HGBA1C    Assessment & Plan:  1. Substernal chest pain; 2. Epigastric pain Patient with complaint of ongoing issues with substernal chest pain.  He also has epigastric tenderness on exam and complaint of reflux type symptoms.  He also has  had recent increase in cough.  We will have patient obtain chest x-ray due to his COPD and smoking history.  We will have patient start omeprazole 40 mg twice daily to decrease stomach acid to see if acid reflux is the cause of the substernal chest pain.  He is to follow-up in a few weeks to make sure that pain has improved.  If he has increased chest pain or any other concerns, he is aware that he should go to the emergency department for further evaluation.  We will also check electrolytes to look for any  electrolyte abnormality that could be contributing to substernal chest pain and will check CBC to look for any blood loss/anemia related to substernal chest pain/epigastric plain and reflux. - DG Chest 2 View; Future - omeprazole (PRILOSEC) 40 MG capsule; One pill in the morning and one before bedtime to decrease stomach acid  Dispense: 180 capsule; Refill: 1 - CBC - Basic Metabolic Panel  3. COPD with exacerbation (HCC); tobacco dependence Patient with COPD and continued tobacco use.  He does not feel that he is able to stop smoking at this time.  He was made aware that there is a clinical pharmacist with whom he can meet to discuss smoking cessation and receive medication such as nicotine patches/gums or oral medications to help with smoking cessation.  He has been asked to obtain a chest x-ray as he is also having substernal chest pain along with his recent increase in cough and congestion.  Prescription provided for doxycycline 100 mg twice daily x10 days along with prednisone 5 mg daily x5 days to help with chest congestion and cough/COPD exacerbation.  Refills provided for patient's albuterol and Symbicort for treatment of COPD. - DG Chest 2 View; Future - doxycycline (VIBRA-TABS) 100 MG tablet; Take 1 tablet (100 mg total) by mouth 2 (two) times daily.  Dispense: 20 tablet; Refill: 0 - predniSONE (DELTASONE) 20 MG tablet; Two pills once per day for 5 days; take after eating  Dispense: 10 tablet; Refill: 0 - albuterol (VENTOLIN HFA) 108 (90 Base) MCG/ACT inhaler; Inhale 2 puffs into the lungs every 6 (six) hours as needed for wheezing or shortness of breath.  Dispense: 18 g; Refill: 11 - budesonide-formoterol (SYMBICORT) 160-4.5 MCG/ACT inhaler; Inhale 2 puffs into the lungs 2 (two) times daily.  Dispense: 1 each; Refill: 11  4. Anxiety and depression; 6.  Need for follow-up by social worker Patient with complaint of anxiety and possible depression.  He reports that he tends to find himself  constantly worrying which is also causing sleep disturbances.  Treatment options discussed and patient is willing to try sertraline 50 mg daily to help improve his anxiety and depression symptoms.  He is aware that it may take up to 4 weeks for him to feel as well as he is going to feel on the medication.  If he develops any suicidal thoughts or ideations while on the medication, he should contact the office but also seek emergency department follow-up if he has suicidal ideations.  Social work referral will also be made so that patient can talk with Child psychotherapist regarding short and long-term counseling. - sertraline (ZOLOFT) 50 MG tablet; Take 1 tablet (50 mg total) by mouth daily. Before bedtime to decrease worry/anxiety  Dispense: 90 tablet; Refill: 1 - Social work consult  5. URI with cough and congestion Patient with recent increase in cough and nasal congestion and on exam he has evidence of nasal drainage and postnasal drainage.  Prescription for cetirizine 10 mg at bedtime to help with nasal congestion and he will also hopefully have improved sleep with the use of the medication. - cetirizine (ZYRTEC) 10 MG tablet; Take 1 tablet (10 mg total) by mouth at bedtime. For nasal congestion  Dispense: 30 tablet; Refill: 11     Follow-up: Return in about 3 weeks (around 09/23/2020) for substernal chest pain/anxiety- sooner if needed.   Cain Saupe, MD

## 2020-09-02 NOTE — Progress Notes (Signed)
Red spots coming and going on skin  Feels like COPD is flared

## 2020-09-05 ENCOUNTER — Telehealth: Payer: Self-pay

## 2020-09-05 NOTE — Telephone Encounter (Signed)
Att to contact pt to advise of xray noa ns phone just rings.

## 2020-09-05 NOTE — Telephone Encounter (Signed)
-----   Message from Cain Saupe, MD sent at 09/02/2020  5:35 PM EDT ----- Lungs appear clear on chest CT.  Please take medications as prescribed at your visit

## 2020-09-26 ENCOUNTER — Ambulatory Visit: Payer: Medicaid Other | Admitting: Family Medicine

## 2020-09-29 ENCOUNTER — Encounter: Payer: Self-pay | Admitting: Family Medicine

## 2020-09-29 ENCOUNTER — Ambulatory Visit: Payer: Self-pay | Attending: Family Medicine | Admitting: Family Medicine

## 2020-09-29 ENCOUNTER — Other Ambulatory Visit: Payer: Self-pay

## 2020-09-29 VITALS — BP 129/74 | HR 62 | Wt 135.0 lb

## 2020-09-29 DIAGNOSIS — F32A Depression, unspecified: Secondary | ICD-10-CM

## 2020-09-29 DIAGNOSIS — F419 Anxiety disorder, unspecified: Secondary | ICD-10-CM

## 2020-09-29 DIAGNOSIS — R1013 Epigastric pain: Secondary | ICD-10-CM

## 2020-09-29 DIAGNOSIS — J449 Chronic obstructive pulmonary disease, unspecified: Secondary | ICD-10-CM

## 2020-09-29 DIAGNOSIS — R1084 Generalized abdominal pain: Secondary | ICD-10-CM

## 2020-09-29 MED ORDER — BUSPIRONE HCL 10 MG PO TABS: 10.0000 mg | ORAL_TABLET | Freq: Two times a day (BID) | ORAL | 3 refills | Status: AC

## 2020-09-29 MED ORDER — ALPRAZOLAM 0.5 MG PO TABS: 0.5000 mg | ORAL_TABLET | Freq: Every evening | ORAL | 0 refills | Status: DC | PRN

## 2020-09-29 MED ORDER — SERTRALINE HCL 100 MG PO TABS: 100.0000 mg | ORAL_TABLET | Freq: Every day | ORAL | 1 refills | Status: AC

## 2020-09-29 NOTE — Progress Notes (Signed)
Pt states Zoloft has him anxious and unable to concentrate

## 2020-09-29 NOTE — Patient Instructions (Signed)
YOUR DOSE OF SERTRALINE IS BEING INCREASED FROM 50 MG TO 100 MG. YOU MAY TAKE TWO OF YOUR CURRENT 50 MG PILLS TO MAKE THE 100 MG DOSE BUT A NEW PRESCRIPTION IS AT YOUR [facility] FOR THE 100 MG PILL. NEW PRESCRIPTIONS ARE ALSO AT YOUR PHARMACY FOR BUSPAR AND ALPRAZOLAM TO ALSO HELP WITH YOUR ANXIETY.

## 2020-09-29 NOTE — Progress Notes (Signed)
Established Patient Office Visit  Subjective:  Patient ID: Douglas Guzman, male    DOB: 07-29-1981  Age: 39 y.o. MRN: [id number]  CC: No chief complaint on file.   HPI Douglas Guzman, 39 yo male, who presents in follow-up of his 09/02/2020 visit at which time patient had an increase in COPD symptoms/COPD exacerbation as well as an increase in GERD symptoms with substernal chest pain and epigastric tenderness. He also had increased anxiety and depression for which he agreed to start the use of sertraline 50 mg daily and he returns today for follow-up of issues from his last visit.          At today's visit, patient with complaint of continued cough and issues with shortness of breath.  He does feel that his symptoms have improved since his last visit.  His sputum is now white or clear instead of yellow.  He denies any fever or chills.  He reports continued issues with anxiety.  He reports that if he is not busy, such as when he is at home after work, he will feel anxious and start pacing.  He continues to have difficulty with sleeping.  He continues to have recurrent issues with worrying.  He is not sure that the sertraline that he started since his last visit has caused any significant improvement in his anxiety.  He feels as if he needs something that he can take more than once per day to help with his anxiety.          He does feel that the medication prescribed at his last visit has helped decrease his acid reflux symptoms.  He does still have issues with generalized abdominal pain.  Upon questioning, he denies any blood in the stool but he has had dark looking stools.  Past Medical History:  Diagnosis Date  . COPD (chronic obstructive pulmonary disease) (HCC)   . Tobacco dependence     Past Surgical History:  Procedure Laterality Date  . BRAIN SURGERY    . INGUINAL HERNIA REPAIR     occurred in childhood    Family History  Problem Relation Age of Onset  . Diabetes Mother   . COPD  Mother   . CAD Father   . Throat cancer Father     Social History   Socioeconomic History  . Marital status: Single    Spouse name: Not on file  . Number of children: Not on file  . Years of education: Not on file  . Highest education level: Not on file  Occupational History  . Not on file  Tobacco Use  . Smoking status: Current Every Day Smoker    Packs/day: 2.00    Years: 26.00    Pack years: 52.00    Types: Cigarettes  . Smokeless tobacco: Never Used  Vaping Use  . Vaping Use: Former  Substance and Sexual Activity  . Alcohol use: Yes    Comment: occ  . Drug use: Yes    Types: Marijuana  . Sexual activity: Yes  Other Topics Concern  . Not on file  Social History Narrative  . Not on file   Social Determinants of Health   Financial Resource Strain:   . Difficulty of Paying Living Expenses: Not on file  Food Insecurity:   . Worried About Programme researcher, broadcasting/film/videounning Out of Food in the Last Year: Not on file  . Ran Out of Food in the Last Year: Not on file  Transportation Needs:   .  Lack of Transportation (Medical): Not on file  . Lack of Transportation (Non-Medical): Not on file  Physical Activity:   . Days of Exercise per Week: Not on file  . Minutes of Exercise per Session: Not on file  Stress:   . Feeling of Stress : Not on file  Social Connections:   . Frequency of Communication with Friends and Family: Not on file  . Frequency of Social Gatherings with Friends and Family: Not on file  . Attends Religious Services: Not on file  . Active Member of Clubs or Organizations: Not on file  . Attends Banker Meetings: Not on file  . Marital Status: Not on file  Intimate Partner Violence:   . Fear of Current or Ex-Partner: Not on file  . Emotionally Abused: Not on file  . Physically Abused: Not on file  . Sexually Abused: Not on file    Outpatient Medications Prior to Visit  Medication Sig Dispense Refill  . albuterol (VENTOLIN HFA) 108 (90 Base) MCG/ACT inhaler  Inhale 2 puffs into the lungs every 6 (six) hours as needed for wheezing or shortness of breath. 18 g 11  . budesonide-formoterol (SYMBICORT) 160-4.5 MCG/ACT inhaler Inhale 2 puffs into the lungs 2 (two) times daily. 1 each 11  . cetirizine (ZYRTEC) 10 MG tablet Take 1 tablet (10 mg total) by mouth at bedtime. For nasal congestion 30 tablet 11  . cyclobenzaprine (FLEXERIL) 10 MG tablet Take 1 tablet (10 mg total) by mouth 2 (two) times daily as needed for muscle spasms. (Patient not taking: Reported on 09/02/2020) 20 tablet 0  . diclofenac sodium (VOLTAREN) 1 % GEL Apply 4 g topically 4 (four) times daily. 100 g 0  . doxycycline (VIBRA-TABS) 100 MG tablet Take 1 tablet (100 mg total) by mouth 2 (two) times daily. 20 tablet 0  . omeprazole (PRILOSEC) 40 MG capsule One pill in the morning and one before bedtime to decrease stomach acid 180 capsule 1  . predniSONE (DELTASONE) 20 MG tablet Two pills once per day for 5 days; take after eating 10 tablet 0  . sertraline (ZOLOFT) 50 MG tablet Take 1 tablet (50 mg total) by mouth daily. Before bedtime to decrease worry/anxiety 90 tablet 1  . tiZANidine (ZANAFLEX) 4 MG tablet Take 1 tablet (4 mg total) by mouth every 8 (eight) hours as needed. 30 tablet 0   No facility-administered medications prior to visit.    No Known Allergies  ROS Review of Systems  Constitutional: Positive for fatigue. Negative for chills and fever.  HENT: Negative for sore throat and trouble swallowing.   Respiratory: Positive for cough and shortness of breath.   Cardiovascular: Negative for chest pain and palpitations.  Gastrointestinal: Positive for abdominal pain. Negative for blood in stool, constipation, diarrhea and nausea.  Endocrine: Negative for polydipsia, polyphagia and polyuria.  Genitourinary: Negative for dysuria and frequency.  Musculoskeletal: Positive for arthralgias and back pain.  Skin: Negative for rash and wound.  Neurological: Negative for dizziness and  headaches.  Hematological: Negative for adenopathy. Does not bruise/bleed easily.  Psychiatric/Behavioral: Positive for sleep disturbance. The patient is nervous/anxious.       Objective:    Physical Exam Vitals and nursing note reviewed.  Constitutional:      General: He is not in acute distress.    Appearance: Normal appearance.  Cardiovascular:     Rate and Rhythm: Normal rate and regular rhythm.  Pulmonary:     Effort: Pulmonary effort is normal.  Breath sounds: Normal breath sounds.     Comments: CTA bilaterally with mild decrease in air movement. No increased work of breathing Abdominal:     Palpations: Abdomen is soft.     Tenderness: There is abdominal tenderness. There is right CVA tenderness. There is no left CVA tenderness, guarding or rebound.     Comments: Epigastric and generalized abdominal tenderness to palpation  Musculoskeletal:     Cervical back: No tenderness.     Right lower leg: No edema.     Left lower leg: No edema.  Lymphadenopathy:     Cervical: No cervical adenopathy.  Skin:    General: Skin is warm and dry.  Neurological:     Mental Status: He is alert and oriented to person, place, and time. Mental status is at baseline.  Psychiatric:     Comments: Patient appears anxious and has some "fidgeting" of hands and feet     BP 129/74 (BP Location: Left Arm, Patient Position: Sitting)   Pulse 62   Wt 135 lb (61.2 kg)   SpO2 97%   BMI 20.53 kg/m  Wt Readings from Last 3 Encounters:  09/02/20 137 lb (62.1 kg)  05/06/20 140 lb (63.5 kg)  04/02/20 140 lb (63.5 kg)     Health Maintenance Due  Topic Date Due  . Hepatitis C Screening  Never done  . COVID-19 Vaccine (1) Never done  . HIV Screening  Never done  . INFLUENZA VACCINE  06/26/2020      Lab Results  Component Value Date   TSH 1.450 01/13/2008   Lab Results  Component Value Date   WBC 10.4 04/02/2020   HGB 16.9 04/02/2020   HCT 50.3 04/02/2020   MCV 91.6 04/02/2020   PLT  187 04/02/2020   Lab Results  Component Value Date   NA 135 11/07/2019   K 4.0 11/07/2019   CO2 22 11/07/2019   GLUCOSE 131 (H) 11/07/2019   BUN 17 11/07/2019   CREATININE 1.31 (H) 11/07/2019   BILITOT 0.7 11/07/2019   ALKPHOS 69 11/07/2019   AST 21 11/07/2019   ALT 17 11/07/2019   PROT 7.1 11/07/2019   ALBUMIN 4.0 11/07/2019   CALCIUM 9.0 11/07/2019   ANIONGAP 11 11/07/2019   Lab Results  Component Value Date   CHOL 162 01/13/2008   Lab Results  Component Value Date   HDL 41 01/13/2008   Lab Results  Component Value Date   LDLCALC 102 (H) 01/13/2008   Lab Results  Component Value Date   TRIG 96 01/13/2008   Lab Results  Component Value Date   CHOLHDL 4.0 Ratio 01/13/2008   No results found for: HGBA1C    Assessment & Plan:  1. COPD GOLD II/ still smoking  Patient with continued issues with cough and shortness of breath but cough is no longer productive of discolored sputum and he has increased ease of breathing at today's visit status post treatment of COPD exacerbation.  Patient was encouraged to stop smoking but feels that he is not able to do that at this time as smoking helps with his anxiety.  Patient will be referred to see the pulmonologist, Dr. Delford Field who works at this office, in follow-up of patient's COPD.  2. Epigastric pain Patient with continued epigastric pain since his last visit but reports that his acid reflux symptoms are improved.  He is to continue the use of omeprazole and he is being referred to gastroenterology for further evaluation and treatment. - Ambulatory referral  to Gastroenterology  3. Anxiety and depression Patient's dose of sertraline will be increased from 50 to 100 mg at bedtime and will add BuSpar 20 mg twice daily.  Patient is also given a short-term prescription for alprazolam 0.5 mg to take at bedtime if needed for sleep/anxiety.  He has also been referred to social work for information/resources regarding anxiety and  information on mental health community resources.  Patient has been asked to return to clinic in 4 weeks for reevaluation after changes in medication but sooner if he is having any difficulty with the medication or increased anxiety.  Emergency department if he is having any suicidal thoughts or ideations. - busPIRone (BUSPAR) 10 MG tablet; Take 1 tablet (10 mg total) by mouth 2 (two) times daily.  Dispense: 60 tablet; Refill: 3 - sertraline (ZOLOFT) 100 MG tablet; Take 1 tablet (100 mg total) by mouth daily. Before bedtime to decrease worry/anxiety  Dispense: 90 tablet; Refill: 1 - ALPRAZolam (XANAX) 0.5 MG tablet; Take 1 tablet (0.5 mg total) by mouth at bedtime as needed for anxiety.  Dispense: 30 tablet; Refill: 0 - Ambulatory referral to Social Work  4. Generalized abdominal pain Patient with epigastric and generalized abdominal pain and he reports that he has noticed some recent dark stools.  Patient is being referred to gastroenterology for further evaluation and treatment. - Ambulatory referral to Gastroenterology   Follow-up: Return in about 4 weeks (around 10/27/2020) for anxiety; also needs an appointment with Dr. Delford Field due to COPD.   Cain Saupe, MD

## 2020-10-06 ENCOUNTER — Telehealth: Payer: Self-pay | Admitting: Licensed Clinical Social Worker

## 2020-10-06 NOTE — Telephone Encounter (Signed)
LCSW placed call to patient regarding [facility] referral. No answer and there was no option for LCSW to leave a message.

## 2020-10-07 MED FILL — $Symbicort 160-4.5mcg/act: 160-4.5 | 90 days supply | Qty: 3 | Fill #1

## 2020-10-10 ENCOUNTER — Ambulatory Visit: Payer: Self-pay

## 2020-10-10 NOTE — Telephone Encounter (Signed)
Please see triage [facility] note !

## 2020-10-10 NOTE — Telephone Encounter (Signed)
Called pt multiple attempts  unable to reach or leave VM to relay Md message.

## 2020-10-10 NOTE — Telephone Encounter (Signed)
Pt. Reports he had chest pain last night. Lasted 5-10 minutes. Left side of chest. Did not radiate. Pain was 7-8/10. Concerned. No availability. No pain today."I feel pretty good today." Instructed if pain returns, go to ED. Pt. Would like to be seen in the practice. Please advise pt. Reason for Disposition . [1] Chest pain lasting < 5 minutes AND [2] NO chest pain or cardiac symptoms (e.g., breathing difficulty, sweating) now (Exception: chest pains that last only a few seconds)  Answer Assessment - Initial Assessment Questions 1. LOCATION: "Where does it hurt?"       Left side 2. RADIATION: "Does the pain go anywhere else?" (e.g., into neck, jaw, arms, back)     No 3. ONSET: "When did the chest pain begin?" (Minutes, hours or days)      Last night 4. PATTERN "Does the pain come and go, or has it been constant since it started?"  "Does it get worse with exertion?"      Comes and goes 5. DURATION: "How long does it last" (e.g., seconds, minutes, hours)     5-10 minutes 6. SEVERITY: "How bad is the pain?"  (e.g., Scale 1-10; mild, moderate, or severe)    - MILD (1-3): doesn't interfere with normal activities     - MODERATE (4-7): interferes with normal activities or awakens from sleep    - SEVERE (8-10): excruciating pain, unable to do any normal activities       Last night - 7-8 7. CARDIAC RISK FACTORS: "Do you have any history of heart problems or risk factors for heart disease?" (e.g., angina, prior heart attack; diabetes, high blood pressure, high cholesterol, smoker, or strong family history of heart disease)     No 8. PULMONARY RISK FACTORS: "Do you have any history of lung disease?"  (e.g., blood clots in lung, asthma, emphysema, birth control pills)     No 9. CAUSE: "What do you think is causing the chest pain?"     Unsure 10. OTHER SYMPTOMS: "Do you have any other symptoms?" (e.g., dizziness, nausea, vomiting, sweating, fever, difficulty breathing, cough)       Shortness of  breath 11. PREGNANCY: "Is there any chance you are pregnant?" "When was your last menstrual period?"       n/a  Protocols used: CHEST PAIN-A-AH

## 2020-10-10 NOTE — Telephone Encounter (Signed)
Patient should go to ED for chest pain evaluation and you can see who is available to see patient in office

## 2020-10-11 ENCOUNTER — Encounter (HOSPITAL_COMMUNITY): Payer: Self-pay

## 2020-10-11 ENCOUNTER — Emergency Department (HOSPITAL_COMMUNITY): Payer: Medicaid Other

## 2020-10-11 ENCOUNTER — Emergency Department (HOSPITAL_COMMUNITY)
Admission: EM | Admit: 2020-10-11 | Discharge: 2020-10-11 | Disposition: A | Discharge status: Home / Self Care | Payer: Medicaid Other | Attending: Emergency Medicine | Admitting: Emergency Medicine

## 2020-10-11 ENCOUNTER — Other Ambulatory Visit: Payer: Self-pay

## 2020-10-11 DIAGNOSIS — J449 Chronic obstructive pulmonary disease, unspecified: Secondary | ICD-10-CM | POA: Insufficient documentation

## 2020-10-11 DIAGNOSIS — R072 Precordial pain: Secondary | ICD-10-CM

## 2020-10-11 DIAGNOSIS — Z7951 Long term (current) use of inhaled steroids: Secondary | ICD-10-CM | POA: Insufficient documentation

## 2020-10-11 DIAGNOSIS — F17210 Nicotine dependence, cigarettes, uncomplicated: Secondary | ICD-10-CM | POA: Insufficient documentation

## 2020-10-11 LAB — CBC
HCT: 50.6 % (ref 39.0–52.0)
Hemoglobin: 17.1 g/dL — ABNORMAL HIGH (ref 13.0–17.0)
MCH: 32 pg (ref 26.0–34.0)
MCHC: 33.8 g/dL (ref 30.0–36.0)
MCV: 94.6 fL (ref 80.0–100.0)
Platelets: 164 10*3/uL (ref 150–400)
RBC: 5.35 MIL/uL (ref 4.22–5.81)
RDW: 13.4 % (ref 11.5–15.5)
WBC: 10.1 10*3/uL (ref 4.0–10.5)
nRBC: 0 % (ref 0.0–0.2)

## 2020-10-11 LAB — BASIC METABOLIC PANEL
Anion gap: 11 (ref 5–15)
BUN: 12 mg/dL (ref 6–20)
CO2: 24 mmol/L (ref 22–32)
Calcium: 8.6 mg/dL — ABNORMAL LOW (ref 8.9–10.3)
Chloride: 101 mmol/L (ref 98–111)
Creatinine, Ser: 1.21 mg/dL (ref 0.61–1.24)
GFR, Estimated: >60 mL/min (ref >60)
Glucose, Bld: 79 mg/dL (ref 70–99)
Potassium: 4.3 mmol/L (ref 3.5–5.1)
Sodium: 136 mmol/L (ref 135–145)

## 2020-10-11 LAB — TROPONIN I (HIGH SENSITIVITY)
Troponin I (High Sensitivity): 6 ng/L (ref <18)
Troponin I (High Sensitivity): 6 ng/L (ref <18)

## 2020-10-11 MED ORDER — ALUM & MAG HYDROXIDE-SIMETH 200-200-20 MG/5ML PO SUSP
30.0000 mL | Freq: Once | ORAL | Status: AC
  Administered 2020-10-11: 30 mL via ORAL
  Filled 2020-10-11: qty 30

## 2020-10-11 MED ORDER — LIDOCAINE VISCOUS HCL 2 % MT SOLN
15.0000 mL | Freq: Once | OROMUCOSAL | Status: AC
  Administered 2020-10-11: 15 mL via ORAL
  Filled 2020-10-11: qty 15

## 2020-10-11 NOTE — ED Triage Notes (Signed)
Pt reports intermittent left sided chest pain with morning sweats for the past 2 days. No other symptoms.

## 2020-10-11 NOTE — ED Provider Notes (Signed)
[name] EMERGENCY DEPARTMENT Provider Note   CSN: [id number] Arrival date & time: 10/11/20  1138     History Chief Complaint  Patient presents with  . Chest Pain    Ryota Treece Zundel is a 39 y.o. male with PMH/o COPD, Smoking who presents for evaluation of chest pain.  He states that it initially started 3 days ago.  He states that the first episode he had was when he was driving.  He states it felt like a sharp pain across his left chest.  He does endorse getting sweaty and having some nausea/vomiting shortness of breath.  He states that it lasted about 5 to 10 minutes and then resolved on its own.  He states that this has continued to occur over the last 3 days.  He states that there is no inciting factor and that he has had this when he is laying in bed as well as when he is driving.  He states that it goes away eventually by itself and he is not taking any medications for it.  He states he feels like it is slightly worse when he gets up and walks around but he states that getting up walking around does not induce the pain.  He has not had any pleuritic chest pain.  He states he occasionally will have some shortness of breath but states that is not necessarily abnormal for him given his history of COPD and tobacco use.  He states that he kept getting diaphoretic with it so he came to the emergency department.  He has not been sick recently denies any fevers, cough, chills.  He smokes about a pack of cigarettes a day.  No personal cardiac history.  He states his dad had a history of cardiovascular disease and thinks he had stents placed when he was in his 56s.  No other family cardiac history.  He has a history of high blood pressure but states he is not on medications.  No history of diabetes.  The history is provided by the patient.    HPI: A 39 year old patient presents for evaluation of chest pain. Initial onset of pain was less than one hour ago. The patient's chest  pain is described as heaviness/pressure/tightness and is not worse with exertion. The patient reports some diaphoresis. The patient's chest pain is middle- or left-sided, is not well-localized, is not sharp and does not radiate to the arms/jaw/neck. The patient does not complain of nausea. The patient has smoked in the past 90 days and has a family history of coronary artery disease in a first-degree relative with onset less than age 47. The patient has no history of stroke, has no history of peripheral artery disease, denies any history of treated diabetes, is not hypertensive, has no history of hypercholesterolemia and does not have an elevated BMI (>=30).   Past Medical History:  Diagnosis Date  . COPD (chronic obstructive pulmonary disease) (HCC)   . Tobacco dependence     Patient Active Problem List   Diagnosis Date Noted  . DOE (dyspnea on exertion) 09/02/2018  . COPD GOLD II/ still smoking  09/02/2018  . Cigarette smoker 09/02/2018    Past Surgical History:  Procedure Laterality Date  . BRAIN SURGERY    . INGUINAL HERNIA REPAIR     occurred in childhood       Family History  Problem Relation Age of Onset  . Diabetes Mother   . COPD Mother   . CAD Father   .  Throat cancer Father     Social History   Tobacco Use  . Smoking status: Current Every Day Smoker    Packs/day: 2.00    Years: 26.00    Pack years: 52.00    Types: Cigarettes  . Smokeless tobacco: Never Used  Vaping Use  . Vaping Use: Former  Substance Use Topics  . Alcohol use: Yes    Comment: occ  . Drug use: Yes    Types: Marijuana    Home Medications Prior to Admission medications   Medication Sig Start Date End Date Taking? Authorizing Provider  albuterol (VENTOLIN HFA) 108 (90 Base) MCG/ACT inhaler Inhale 2 puffs into the lungs every 6 (six) hours as needed for wheezing or shortness of breath. 09/02/20  Yes [name], [name], MD  ALPRAZolam (XANAX) 0.5 MG tablet Take 1 tablet (0.5 mg total) by mouth at  bedtime as needed for anxiety. 09/29/20  Yes [name], [name], MD  budesonide-formoterol (SYMBICORT) 160-4.5 MCG/ACT inhaler Inhale 2 puffs into the lungs 2 (two) times daily. 09/02/20  Yes [name], [name], MD  busPIRone (BUSPAR) 10 MG tablet Take 1 tablet (10 mg total) by mouth 2 (two) times daily. 09/29/20  Yes [name], [name], MD  cetirizine (ZYRTEC) 10 MG tablet Take 1 tablet (10 mg total) by mouth at bedtime. For nasal congestion 09/02/20  Yes [name], [name], MD  diclofenac sodium (VOLTAREN) 1 % GEL Apply 4 g topically 4 (four) times daily. 12/22/18  Yes Melene PlanFloyd, Dan, DO  doxycycline (VIBRA-TABS) 100 MG tablet Take 1 tablet (100 mg total) by mouth 2 (two) times daily. 09/02/20  Yes [name], [name], MD  omeprazole (PRILOSEC) 40 MG capsule One pill in the morning and one before bedtime to decrease stomach acid Patient taking differently: Take 40 mg by mouth in the morning and at bedtime.  09/02/20  Yes [name], [name], MD  sertraline (ZOLOFT) 100 MG tablet Take 1 tablet (100 mg total) by mouth daily. Before bedtime to decrease worry/anxiety Patient taking differently: Take 100 mg by mouth at bedtime.  09/29/20  Yes [name], [name], MD    Allergies    Patient has no known allergies.  Review of Systems   Review of Systems  Constitutional: Negative for fever.  Respiratory: Negative for cough and shortness of breath.   Cardiovascular: Positive for chest pain.  Gastrointestinal: Negative for abdominal pain, nausea and vomiting.  Genitourinary: Negative for dysuria and hematuria.  Neurological: Negative for weakness, numbness and headaches.  All other systems reviewed and are negative.   Physical Exam Updated Vital Signs BP 108/77 (BP Location: Right Arm)   Pulse 62   Temp 99.1 F (37.3 C) (Oral)   Resp 15   Ht 5\' 8"  (1.727 m)   Wt 63.5 kg   SpO2 95%   BMI 21.29 kg/m   Physical Exam Vitals and nursing note reviewed.  Constitutional:      Appearance: Normal appearance. He is well-developed.  HENT:     Head:  Normocephalic and atraumatic.  Eyes:     General: Lids are normal.     Conjunctiva/sclera: Conjunctivae normal.     Pupils: Pupils are equal, round, and reactive to light.  Cardiovascular:     Rate and Rhythm: Normal rate and regular rhythm.     Pulses: Normal pulses.          Radial pulses are 2+ on the right side and 2+ on the left side.       Dorsalis pedis pulses are 2+ on the right side and  2+ on the left side.     Heart sounds: Normal heart sounds. No murmur heard.  No friction rub. No gallop.   Pulmonary:     Effort: Pulmonary effort is normal.     Breath sounds: Normal breath sounds.     Comments: Lungs clear to auscultation bilaterally.  Symmetric chest rise.  No wheezing, rales, rhonchi. Abdominal:     Palpations: Abdomen is soft. Abdomen is not rigid.     Tenderness: There is no abdominal tenderness. There is no guarding.     Comments: Abdomen is soft, non-distended, non-tender. No rigidity, No guarding. No peritoneal signs.  Musculoskeletal:        General: Normal range of motion.     Cervical back: Full passive range of motion without pain.  Skin:    General: Skin is warm and dry.     Capillary Refill: Capillary refill takes less than 2 seconds.  Neurological:     Mental Status: He is alert and oriented to person, place, and time.  Psychiatric:        Speech: Speech normal.     ED Results / Procedures / Treatments   Labs (all labs ordered are listed, but only abnormal results are displayed) Labs Reviewed  BASIC METABOLIC PANEL - Abnormal; Notable for the following components:      Result Value   Calcium 8.6 (*)    All other components within normal limits  CBC - Abnormal; Notable for the following components:   Hemoglobin 17.1 (*)    All other components within normal limits  TROPONIN I (HIGH SENSITIVITY)  TROPONIN I (HIGH SENSITIVITY)    EKG EKG Interpretation  Date/Time:  Tuesday October 11 2020 11:49:20 EST Ventricular Rate:  69 PR  Interval:  150 QRS Duration: 96 QT Interval:  392 QTC Calculation: 420 R Axis:   -97 Text Interpretation: Normal sinus rhythm Biatrial enlargement Indeterminate axis Pulmonary disease pattern Incomplete right bundle branch block Minimal voltage criteria for LVH, may be normal variant ( Cornell product ) Nonspecific ST abnormality Abnormal ECG overall similar to previous Confirmed by Frederick Peers [phone]) on 10/11/2020 1:25:12 PM   Radiology DG Chest 2 View  Result Date: 10/11/2020 CLINICAL DATA:  Chest pain. EXAM: CHEST - 2 VIEW COMPARISON:  09/02/2020. FINDINGS: The heart size and mediastinal contours are within normal limits. Both lungs are clear. No visible pleural effusions or pneumothorax. The visualized skeletal structures are unremarkable. IMPRESSION: No active cardiopulmonary disease. Electronically Signed   By: Feliberto Harts MD   On: 10/11/2020 12:56    Procedures Procedures (including critical care time)  Medications Ordered in ED Medications  alum & mag hydroxide-simeth (MAALOX/MYLANTA) 200-200-20 MG/5ML suspension 30 mL (30 mLs Oral Given 10/11/20 1409)    And  lidocaine (XYLOCAINE) 2 % viscous mouth solution 15 mL (15 mLs Oral Given 10/11/20 1409)    ED Course  I have reviewed the triage vital signs and the nursing notes.  Pertinent labs & imaging results that were available during my care of the patient were reviewed by me and considered in my medical decision making (see chart for details).    MDM Rules/Calculators/A&P HEAR Score: 40                        39 year old male who presents for evaluation of chest pain that has been intermittently occurring for the last 2 days.  He states he has been diaphoretic for the pain.  He states this occurred  at different times.  He has had it sometimes when he is in his car laying down.  He states that exertion does not bring in a lot but sometimes when he has it, it is worse with exertion.  No pleuritic chest pain.  Initial  arrival, he is afebrile, toxic appearing.  Vital signs are stable.  On exam, he is no signs of respiratory distress.  This sounds atypical for ACS etiology but is a consideration.  He does not any shortness of breath or pleuritic chest pain.  Do not suspect PE.  He also reports that occasionally, he will get some epigastric pain with it but states he currently does not have any epigastric pain.  Question if this is GERD related.  We will plan to give GI cocktail.  CBC shows no leukocytosis. Hgb is 17.2. BMP is unremarkable. Initial troponin is negative. CXR shows no evidence of acute cardiopulmonary disease.   Chest x-ray is unremarkable.  Patient with a HEART score of 3. At this time, his pain has some atypical and typical features but mostly doesn't sound consistent ACS. Will obtain a delta trop.  Review of his tele strip showed some occasional PVCs but otherwise unremarkable.   Patient signed out to [facility], PA-C with delta trop pending.   Portions of this note were generated with Scientist, clinical (histocompatibility and immunogenetics). Dictation errors may occur despite best attempts at proofreading.   Final Clinical Impression(s) / ED Diagnoses Final diagnoses:  Precordial pain    Rx / DC Orders ED Discharge Orders    None       Maxwell Caul, PA-C 10/12/20 0846    Little, Ambrose Finland, MD 10/14/20 [phone]

## 2020-10-11 NOTE — ED Provider Notes (Signed)
4:01 PM Signout from [employer] at shift change. Pt with atypical chest pain. Awaiting completion of work-up.  Patient did have a 3 beat episode of nonsustained VT.  I reviewed telemetry which did not show any further episodes.  2nd marker was 6.  Patient denies lightheadedness or syncope.  Pt seen.  He was updated on results.  Currently does not complain of chest pain.  He looks well and is comfortable with discharge home.  Urged PCP follow-up for recheck.  BP 108/77 (BP Location: Right Arm)   Pulse 62   Temp 99.1 F (37.3 C) (Oral)   Resp 15   Ht 5\' 8"  (1.727 m)   Wt 63.5 kg   SpO2 95%   BMI 21.29 kg/m    Patient was counseled to return with severe chest pain, especially if the pain is crushing or pressure-like and spreads to the arms, back, neck, or jaw, or if they have sweating, nausea, or shortness of breath with the pain. They were encouraged to call 911 with these symptoms.   The patient verbalized understanding and agreed.     , PA-C 10/11/20 1606    10/13/20, MD 10/11/20 2218

## 2020-10-11 NOTE — Discharge Instructions (Signed)
Please read and follow all provided instructions.  Your diagnoses today include:  1. Precordial pain     Tests performed today include:  An EKG of your heart  A chest x-ray  Cardiac enzymes - a blood test for heart muscle damage  Blood counts and electrolytes  Vital signs. See below for your results today.   Medications prescribed:   None  Take any prescribed medications only as directed.  Follow-up instructions: Please follow-up with your primary care provider in the next 3 days for further evaluation of your symptoms.   Return instructions:  SEEK IMMEDIATE MEDICAL ATTENTION IF:  You have severe chest pain, especially if the pain is crushing or pressure-like and spreads to the arms, back, neck, or jaw, or if you have sweating, nausea (feeling sick to your stomach), or shortness of breath. THIS IS AN EMERGENCY. Don't wait to see if the pain will go away. Get medical help at once. Call 911 or 0 (operator). DO NOT drive yourself to the hospital.   Your chest pain gets worse and does not go away with rest.   You have an attack of chest pain lasting longer than usual, despite rest and treatment with the medications your caregiver has prescribed.   You wake from sleep with chest pain or shortness of breath.  You feel dizzy or faint.  You have chest pain not typical of your usual pain for which you originally saw your caregiver.   You have any other emergent concerns regarding your health.  Additional Information: Chest pain comes from many different causes. Your caregiver has diagnosed you as having chest pain that is not specific for one problem, but does not require admission.  You are at low risk for an acute heart condition or other serious illness.   Your vital signs today were: BP 127/78   Pulse 79   Temp 98.9 F (37.2 C) (Oral)   Resp 18   Ht 5\' 8"  (1.727 m)   Wt 63.5 kg   SpO2 97%   BMI 21.29 kg/m  If your blood pressure (BP) was elevated above 135/85 this  visit, please have this repeated by your doctor within one month. --------------

## 2020-10-12 ENCOUNTER — Telehealth: Payer: Self-pay | Admitting: Licensed Clinical Social Worker

## 2020-10-12 NOTE — Telephone Encounter (Signed)
Pt followed advise and was seen yesterday in the ED/

## 2020-10-12 NOTE — Telephone Encounter (Signed)
BH Intern called pt to schedule appt for SW services based on a referral from Dr. Jillyn Hidden, but pt's number would not ring and no message was able to be left.

## 2020-11-05 ENCOUNTER — Other Ambulatory Visit: Payer: Self-pay | Admitting: Family Medicine

## 2020-11-05 DIAGNOSIS — F419 Anxiety disorder, unspecified: Secondary | ICD-10-CM

## 2020-11-05 NOTE — Telephone Encounter (Signed)
Requested medications are due for refill today yes  Requested medications are on the active medication list yes  Last refill 11/4  Last visit 11/4  Future visit scheduled no  Notes to clinic OV note on 11/4 states return in 4 weeks, no upcoming visit scheduled.

## 2021-01-11 ENCOUNTER — Emergency Department (HOSPITAL_COMMUNITY)
Admission: EM | Admit: 2021-01-11 | Discharge: 2021-01-11 | Disposition: A | Discharge status: Home / Self Care | Payer: Medicaid Other | Attending: Emergency Medicine | Admitting: Emergency Medicine

## 2021-01-11 ENCOUNTER — Encounter (HOSPITAL_COMMUNITY): Payer: Self-pay | Admitting: Emergency Medicine

## 2021-01-11 DIAGNOSIS — J449 Chronic obstructive pulmonary disease, unspecified: Secondary | ICD-10-CM | POA: Insufficient documentation

## 2021-01-11 DIAGNOSIS — M79671 Pain in right foot: Secondary | ICD-10-CM

## 2021-01-11 DIAGNOSIS — F17210 Nicotine dependence, cigarettes, uncomplicated: Secondary | ICD-10-CM | POA: Insufficient documentation

## 2021-01-11 DIAGNOSIS — M79672 Pain in left foot: Secondary | ICD-10-CM | POA: Insufficient documentation

## 2021-01-11 DIAGNOSIS — Z7951 Long term (current) use of inhaled steroids: Secondary | ICD-10-CM | POA: Insufficient documentation

## 2021-01-11 LAB — COMPREHENSIVE METABOLIC PANEL
ALT: 19 U/L (ref 0–44)
AST: 22 U/L (ref 15–41)
Albumin: 3.4 g/dL — ABNORMAL LOW (ref 3.5–5.0)
Alkaline Phosphatase: 66 U/L (ref 38–126)
Anion gap: 8 (ref 5–15)
BUN: 10 mg/dL (ref 6–20)
CO2: 26 mmol/L (ref 22–32)
Calcium: 8.5 mg/dL — ABNORMAL LOW (ref 8.9–10.3)
Chloride: 104 mmol/L (ref 98–111)
Creatinine, Ser: 1.26 mg/dL — ABNORMAL HIGH (ref 0.61–1.24)
GFR, Estimated: >60 mL/min (ref >60)
Glucose, Bld: 99 mg/dL (ref 70–99)
Potassium: 4.1 mmol/L (ref 3.5–5.1)
Sodium: 138 mmol/L (ref 135–145)
Total Bilirubin: 1 mg/dL (ref 0.3–1.2)
Total Protein: 5.8 g/dL — ABNORMAL LOW (ref 6.5–8.1)

## 2021-01-11 LAB — CBC WITH DIFFERENTIAL/PLATELET
Abs Immature Granulocytes: 0.07 10*3/uL (ref 0.00–0.07)
Basophils Absolute: 0.1 10*3/uL (ref 0.0–0.1)
Basophils Relative: 1 %
Eosinophils Absolute: 0.2 10*3/uL (ref 0.0–0.5)
Eosinophils Relative: 1 %
HCT: 48.6 % (ref 39.0–52.0)
Hemoglobin: 16.7 g/dL (ref 13.0–17.0)
Immature Granulocytes: 1 %
Lymphocytes Relative: 16 %
Lymphs Abs: 2.1 10*3/uL (ref 0.7–4.0)
MCH: 31.8 pg (ref 26.0–34.0)
MCHC: 34.4 g/dL (ref 30.0–36.0)
MCV: 92.6 fL (ref 80.0–100.0)
Monocytes Absolute: 0.9 10*3/uL (ref 0.1–1.0)
Monocytes Relative: 7 %
Neutro Abs: 9.9 10*3/uL — ABNORMAL HIGH (ref 1.7–7.7)
Neutrophils Relative %: 74 %
Platelets: 166 10*3/uL (ref 150–400)
RBC: 5.25 MIL/uL (ref 4.22–5.81)
RDW: 13.6 % (ref 11.5–15.5)
WBC: 13.2 10*3/uL — ABNORMAL HIGH (ref 4.0–10.5)
nRBC: 0 % (ref 0.0–0.2)

## 2021-01-11 NOTE — ED Provider Notes (Signed)
[name] EMERGENCY DEPARTMENT Provider Note   CSN: [id number] Arrival date & time: 01/11/21  1211     History Chief Complaint  Patient presents with   Foot Pain    Douglas Guzman is a 40 y.o. male.  40 year old male with prior medical history as detailed below presents for evaluation of bilateral foot pain.  Patient reports that he bought new steel toed boots for work 1 month ago.  Patient reports persistent pain to both feet for the last month.  Patient reports that there is some redness along the edges of his foot.  He denies fever.  He denies specific injury.  The history is provided by the patient and medical records.  Foot Pain This is a new problem. The current episode started more than 1 week ago. The problem occurs daily. The problem has not changed since onset.Pertinent negatives include no chest pain and no abdominal pain. Nothing aggravates the symptoms. Nothing relieves the symptoms.       Past Medical History:  Diagnosis Date   COPD (chronic obstructive pulmonary disease) (HCC)    Tobacco dependence     Patient Active Problem List   Diagnosis Date Noted   DOE (dyspnea on exertion) 09/02/2018   COPD GOLD II/ still smoking  09/02/2018   Cigarette smoker 09/02/2018    Past Surgical History:  Procedure Laterality Date   BRAIN SURGERY     INGUINAL HERNIA REPAIR     occurred in childhood       Family History  Problem Relation Age of Onset   Diabetes Mother    COPD Mother    CAD Father    Throat cancer Father     Social History   Tobacco Use   Smoking status: Current Every Day Smoker    Packs/day: 2.00    Years: 26.00    Pack years: 52.00    Types: Cigarettes   Smokeless tobacco: Never Used  Building services engineer Use: Former  Substance Use Topics   Alcohol use: Yes    Comment: occ   Drug use: Yes    Types: Marijuana    Home Medications Prior to Admission medications   Medication Sig Start Date End  Date Taking? Authorizing Provider  albuterol (VENTOLIN HFA) 108 (90 Base) MCG/ACT inhaler Inhale 2 puffs into the lungs every 6 (six) hours as needed for wheezing or shortness of breath. 09/02/20   [name], [name], MD  ALPRAZolam (XANAX) 0.5 MG tablet Take 1 tablet (0.5 mg total) by mouth at bedtime as needed for anxiety. 09/29/20   [name], [name], MD  budesonide-formoterol (SYMBICORT) 160-4.5 MCG/ACT inhaler Inhale 2 puffs into the lungs 2 (two) times daily. 09/02/20   [name], [name], MD  busPIRone (BUSPAR) 10 MG tablet Take 1 tablet (10 mg total) by mouth 2 (two) times daily. 09/29/20   [name], [name], MD  cetirizine (ZYRTEC) 10 MG tablet Take 1 tablet (10 mg total) by mouth at bedtime. For nasal congestion 09/02/20   [name], [name], MD  diclofenac sodium (VOLTAREN) 1 % GEL Apply 4 g topically 4 (four) times daily. 12/22/18   Melene Plan, DO  doxycycline (VIBRA-TABS) 100 MG tablet Take 1 tablet (100 mg total) by mouth 2 (two) times daily. 09/02/20   [name], [name], MD  omeprazole (PRILOSEC) 40 MG capsule One pill in the morning and one before bedtime to decrease stomach acid Patient taking differently: Take 40 mg by mouth in the morning and at bedtime.  09/02/20   [name],  [name], MD  sertraline (ZOLOFT) 100 MG tablet Take 1 tablet (100 mg total) by mouth daily. Before bedtime to decrease worry/anxiety Patient taking differently: Take 100 mg by mouth at bedtime.  09/29/20   Cain Saupe, MD    Allergies    Patient has no known allergies.  Review of Systems   Review of Systems  Cardiovascular: Negative for chest pain.  Gastrointestinal: Negative for abdominal pain.  All other systems reviewed and are negative.   Physical Exam Updated Vital Signs There were no vitals taken for this visit.  Physical Exam Vitals and nursing note reviewed.  Constitutional:      General: He is not in acute distress.    Appearance: Normal appearance. He is well-developed and well-nourished.  HENT:     Head: Normocephalic and  atraumatic.     Mouth/Throat:     Mouth: Oropharynx is clear and moist.  Eyes:     Extraocular Movements: EOM normal.     Conjunctiva/sclera: Conjunctivae normal.     Pupils: Pupils are equal, round, and reactive to light.  Cardiovascular:     Rate and Rhythm: Normal rate and regular rhythm.     Heart sounds: Normal heart sounds.  Pulmonary:     Effort: Pulmonary effort is normal. No respiratory distress.     Breath sounds: Normal breath sounds.  Abdominal:     General: There is no distension.     Palpations: Abdomen is soft.     Tenderness: There is no abdominal tenderness.  Musculoskeletal:        General: No deformity or edema. Normal range of motion.     Cervical back: Normal range of motion and neck supple.  Skin:    General: Skin is warm and dry.  Neurological:     General: No focal deficit present.     Mental Status: He is alert and oriented to person, place, and time.  Psychiatric:        Mood and Affect: Mood and affect normal.     ED Results / Procedures / Treatments   Labs (all labs ordered are listed, but only abnormal results are displayed) Labs Reviewed  CBC WITH DIFFERENTIAL/PLATELET - Abnormal; Notable for the following components:      Result Value   WBC 13.2 (*)    Neutro Abs 9.9 (*)    All other components within normal limits  COMPREHENSIVE METABOLIC PANEL - Abnormal; Notable for the following components:   Creatinine, Ser 1.26 (*)    Calcium 8.5 (*)    Total Protein 5.8 (*)    Albumin 3.4 (*)    All other components within normal limits    EKG None  Radiology No results found.  Procedures Procedures   Medications Ordered in ED Medications - No data to display  ED Course  I have reviewed the triage vital signs and the nursing notes.  Pertinent labs & imaging results that were available during my care of the patient were reviewed by me and considered in my medical decision making (see chart for details).    MDM  Rules/Calculators/A&P                          MDM  Screen complete  Douglas Guzman was evaluated in Emergency Department on 01/11/2021 for the symptoms described in the history of present illness. He was evaluated in the context of the global COVID-19 pandemic, which necessitated consideration that the patient might be at  risk for infection with the SARS-CoV-2 virus that causes COVID-19. Institutional protocols and algorithms that pertain to the evaluation of patients at risk for COVID-19 are in a state of rapid change based on information released by regulatory bodies including the CDC and federal and state organizations. These policies and algorithms were followed during the patient's care in the ED.  Patient presents for evaluation of reported bilateral foot pain.  Patient's pain appears to be related to the recent purchase and use of new steel toed boots for work.  Evidence of rubbing and irritation of the skin along the medial aspects of both great toes.   No evidence of infection or other concurrent illness found.  Patient does understand need for close follow-up with his regular care provider.  Patient also understands need to evaluate his footwear choices.  Final Clinical Impression(s) / ED Diagnoses Final diagnoses:  Foot pain, bilateral    Rx / DC Orders ED Discharge Orders    None       Wynetta Fines, MD 01/11/21 1429

## 2021-01-11 NOTE — ED Triage Notes (Signed)
Pt arrives to ED with chief complaint of bilateral foot pain and swelling, he states this first noticed 1 month ago. He is on his feet all day at work denies any injury. He has swelling in  toes with redness worse on first and 5th toes.

## 2021-01-11 NOTE — Discharge Instructions (Addendum)
Return for any problem.  ?

## 2021-11-05 ENCOUNTER — Other Ambulatory Visit: Payer: Self-pay

## 2021-11-05 ENCOUNTER — Encounter (HOSPITAL_COMMUNITY): Payer: Self-pay

## 2021-11-05 ENCOUNTER — Ambulatory Visit (HOSPITAL_COMMUNITY)
Admission: EM | Admit: 2021-11-05 | Discharge: 2021-11-05 | Disposition: A | Discharge status: Home / Self Care | Payer: Medicaid Other | Attending: Emergency Medicine | Admitting: Emergency Medicine

## 2021-11-05 DIAGNOSIS — H6191 Disorder of right external ear, unspecified: Secondary | ICD-10-CM

## 2021-11-05 MED ORDER — DOXYCYCLINE HYCLATE 100 MG PO CAPS: 100.0000 mg | ORAL_CAPSULE | Freq: Two times a day (BID) | ORAL | 0 refills | Status: DC

## 2021-11-05 NOTE — ED Triage Notes (Signed)
Pt presents with bump on ear x 2 months.

## 2021-11-05 NOTE — ED Provider Notes (Signed)
[facility]ENTER   [id number] 11/05/21 Arrival Time: 1226   No chief complaint on file.   SUBJECTIVE: History from: patient.  Raidyn Wassink Polyakov is a 40 y.o. male who presented to the urgent care with a complaint of bump behind right ear for the past 2 to 3 months.  Denies any precipitating.   Reports the bump is is tender to touch.  Denies any alleviating or aggravating factors.  Denies similar symptoms in the past.  Denies chills, fever, nausea, vomiting and diarrhea.  ROS: As per HPI.  All other pertinent ROS negative.     Past Medical History:  Diagnosis Date   COPD (chronic obstructive pulmonary disease) (HCC)    Tobacco dependence    Past Surgical History:  Procedure Laterality Date   BRAIN SURGERY     INGUINAL HERNIA REPAIR     occurred in childhood   No Known Allergies No current facility-administered medications on file prior to encounter.   Current Outpatient Medications on File Prior to Encounter  Medication Sig Dispense Refill   albuterol (VENTOLIN HFA) 108 (90 Base) MCG/ACT inhaler INHALE 2 PUFFS INTO THE LUNGS EVERY 6 (SIX) HOURS AS NEEDED FOR WHEEZING OR SHORTNESS OF BREATH. 18 g 11   ALPRAZolam (XANAX) 0.5 MG tablet Take 1 tablet (0.5 mg total) by mouth at bedtime as needed for anxiety. 30 tablet 0   budesonide-formoterol (SYMBICORT) 160-4.5 MCG/ACT inhaler INHALE 2 PUFFS INTO THE LUNGS 2 (TWO) TIMES DAILY. 10.2 g 11   busPIRone (BUSPAR) 10 MG tablet Take 1 tablet (10 mg total) by mouth 2 (two) times daily. 60 tablet 3   cetirizine (ZYRTEC) 10 MG tablet Take 1 tablet (10 mg total) by mouth at bedtime. For nasal congestion 30 tablet 11   diclofenac sodium (VOLTAREN) 1 % GEL Apply 4 g topically 4 (four) times daily. 100 g 0   omeprazole (PRILOSEC) 40 MG capsule ONE PILL IN THE MORNING AND ONE BEFORE BEDTIME TO DECREASE STOMACH ACID (Patient taking differently: Take 40 mg by mouth in the morning and at bedtime. ) 180 capsule 1   sertraline (ZOLOFT) 100 MG  tablet Take 1 tablet (100 mg total) by mouth daily. Before bedtime to decrease worry/anxiety (Patient taking differently: Take 100 mg by mouth at bedtime. ) 90 tablet 1   Social History   Socioeconomic History   Marital status: Single    Spouse name: Not on file   Number of children: Not on file   Years of education: Not on file   Highest education level: Not on file  Occupational History   Not on file  Tobacco Use   Smoking status: Every Day    Packs/day: 2.00    Years: 26.00    Pack years: 52.00    Types: Cigarettes   Smokeless tobacco: Never  Vaping Use   Vaping Use: Former  Substance and Sexual Activity   Alcohol use: Yes    Comment: occ   Drug use: Yes    Types: Marijuana   Sexual activity: Yes  Other Topics Concern   Not on file  Social History Narrative   Not on file   Social Determinants of Health   Financial Resource Strain: Not on file  Food Insecurity: Not on file  Transportation Needs: Not on file  Physical Activity: Not on file  Stress: Not on file  Social Connections: Not on file  Intimate Partner Violence: Not on file   Family History  Problem Relation Age of Onset   Diabetes  Mother    COPD Mother    CAD Father    Throat cancer Father     OBJECTIVE:  Vitals:   11/05/21 1510  BP: 121/78  Pulse: 96  Resp: 18  Temp: (!) 97.1 F (36.2 C)  TempSrc: Oral  SpO2: 100%    Physical Exam Vitals and nursing note reviewed.  Constitutional:      General: He is not in acute distress.    Appearance: Normal appearance. He is normal weight. He is not ill-appearing, toxic-appearing or diaphoretic.  HENT:     Ears:     Comments: Bump behind right ear tender to touch Cardiovascular:     Rate and Rhythm: Normal rate and regular rhythm.     Pulses: Normal pulses.     Heart sounds: Normal heart sounds. No murmur heard.   No friction rub. No gallop.  Pulmonary:     Effort: Pulmonary effort is normal. No respiratory distress.     Breath sounds: Normal  breath sounds. No stridor. No wheezing, rhonchi or rales.  Chest:     Chest wall: No tenderness.  Skin:    Comments: Skin lesion behind right ear / tender to touch  Neurological:     Mental Status: He is alert and oriented to person, place, and time.     LABS:  No results found for this or any previous visit (from the past 24 hour(s)).   ASSESSMENT & PLAN:  1. Skin lesion of right ear     Meds ordered this encounter  Medications   doxycycline (VIBRAMYCIN) 100 MG capsule    Sig: Take 1 capsule (100 mg total) by mouth 2 (two) times daily.    Dispense:  20 capsule    Refill:  0    Discharge instructions  Doxycycline was prescribed/take as directed Follow-up with PCP if symptom does not resolve for further reevaluation Return or go to ED if you develop any new or worsening of your symptoms  Reviewed expectations re: course of current medical issues. Questions answered. Outlined signs and symptoms indicating need for more acute intervention. Patient verbalized understanding. After Visit Summary given.           Durward Parcel, FNP 11/05/21 1621

## 2021-11-05 NOTE — Discharge Instructions (Addendum)
Doxycycline was prescribed/take as directed Follow-up with PCP if symptom does not resolve for further reevaluation Return or go to ED if you develop any new or worsening of your symptoms

## 2021-11-30 ENCOUNTER — Ambulatory Visit: Payer: Medicaid Other | Admitting: Physician Assistant

## 2022-08-08 IMAGING — DX DG CHEST 2V
2 series · 2 of 2 positions shown · non-contrast
Comparison: 09/02/2020.

CLINICAL DATA: Chest pain.

EXAM:
CHEST - 2 VIEW

[chest pa]
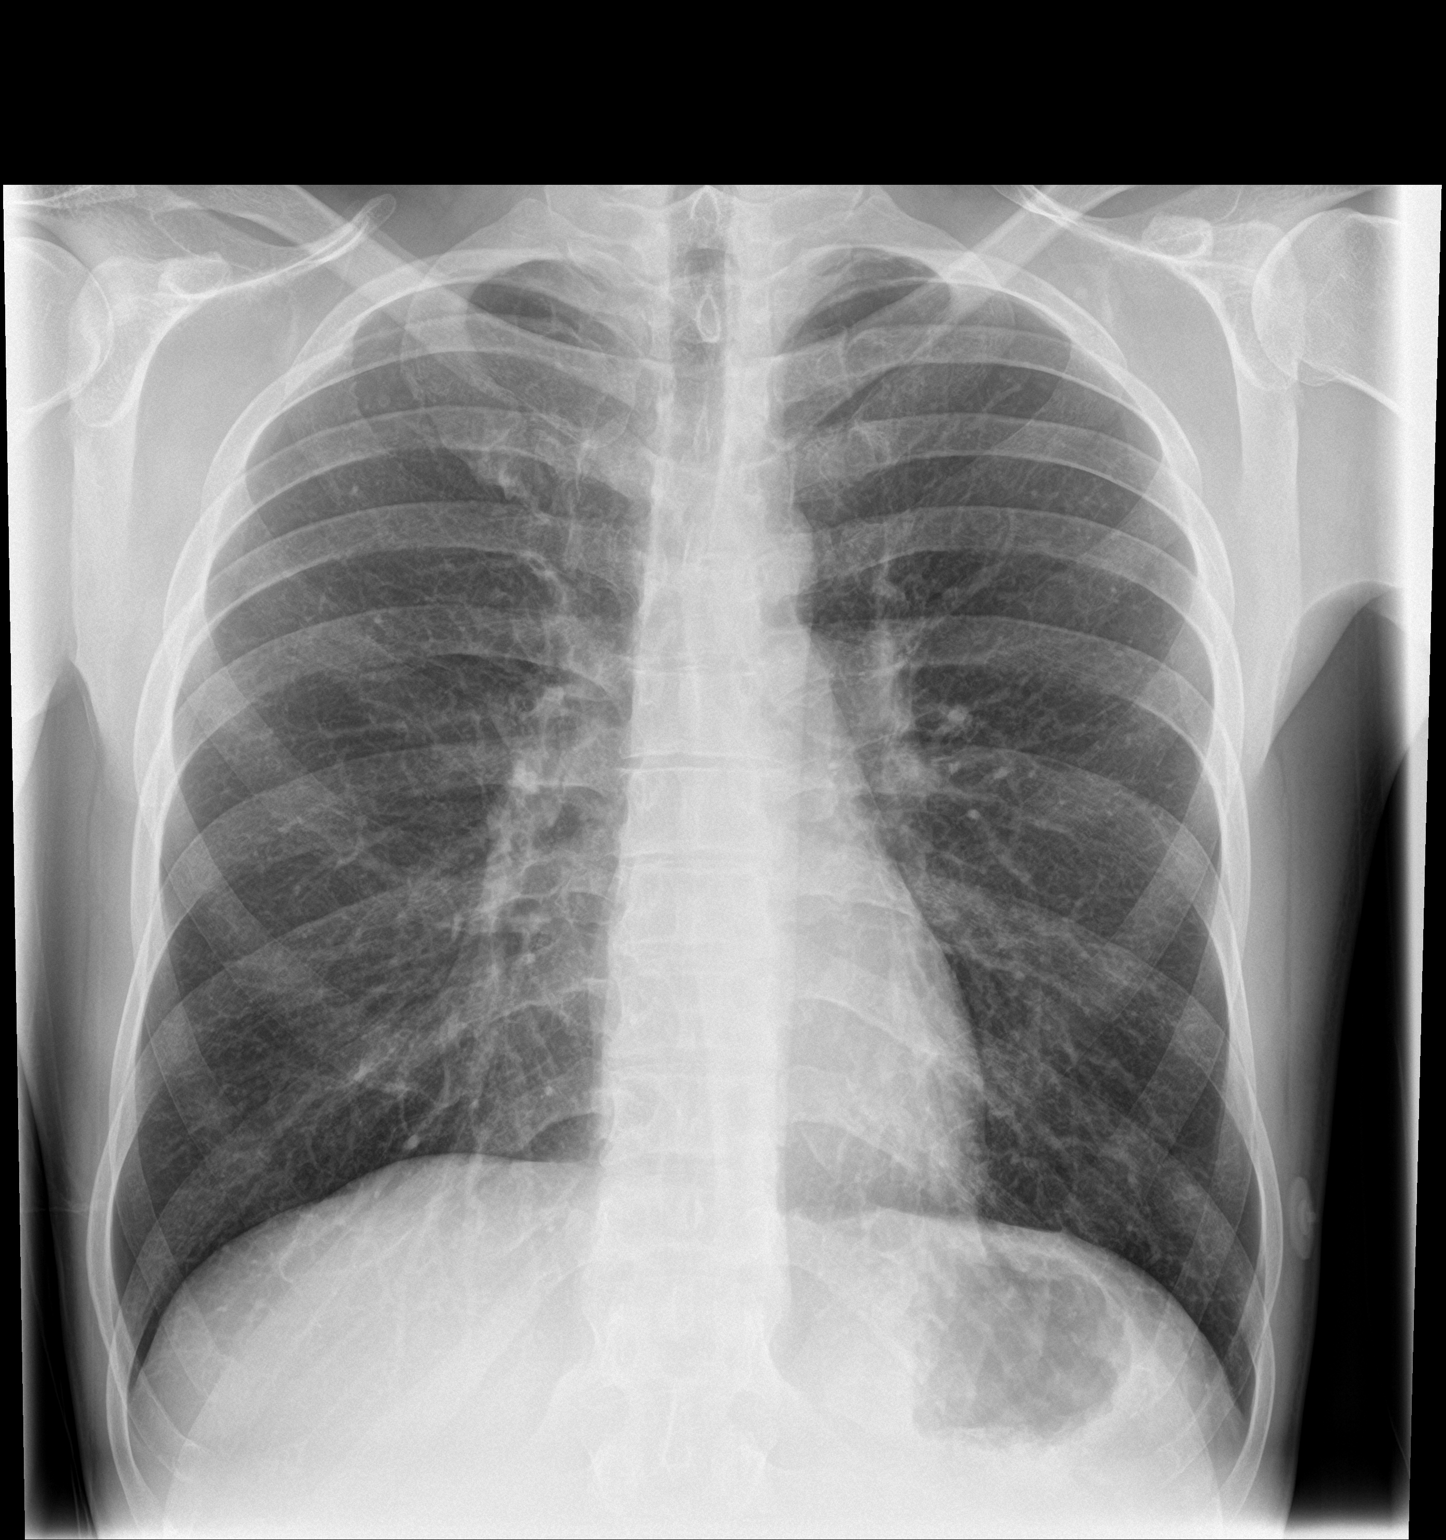

[chest lat]
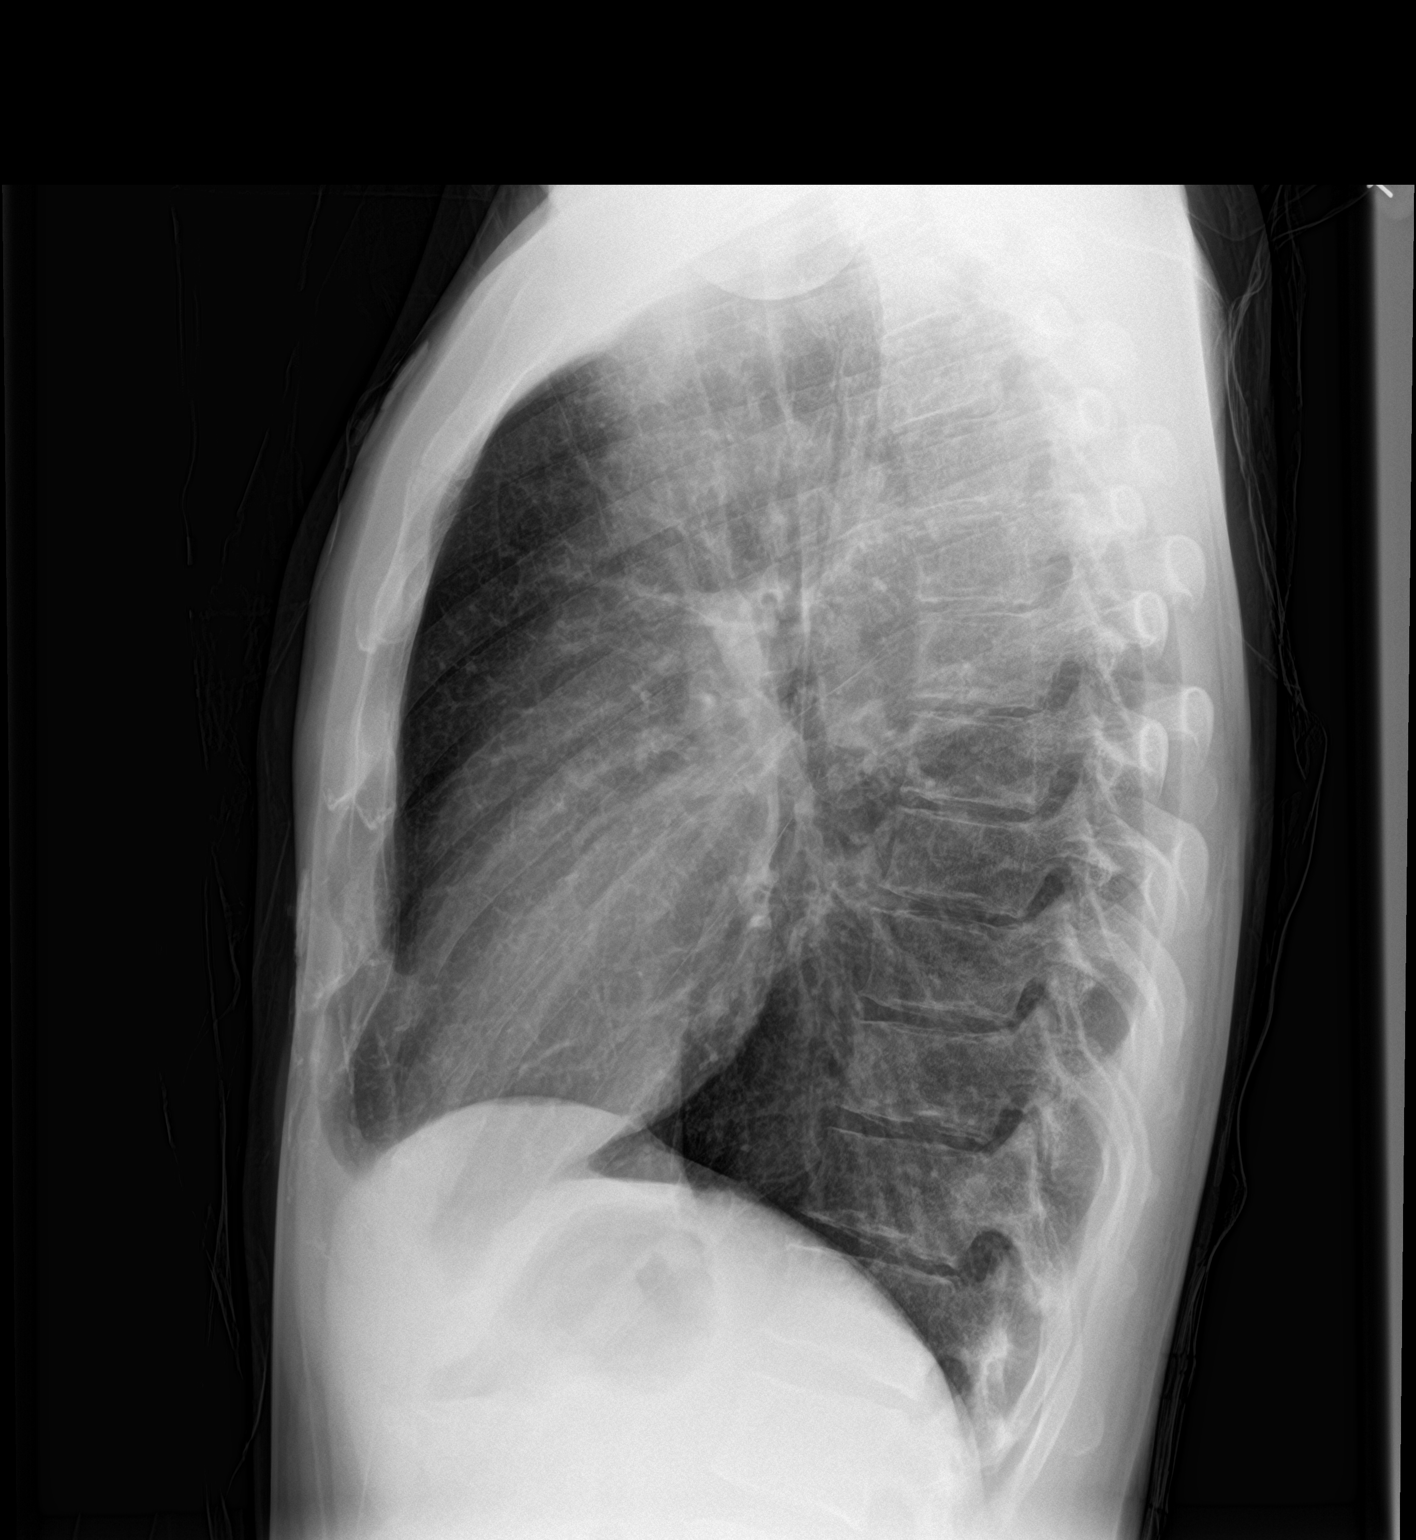

[2 of 2 positions shown; findings below may reference images not displayed]

FINDINGS: The heart size and mediastinal contours are within normal limits.
Both lungs are clear. No visible pleural effusions or pneumothorax.
The visualized skeletal structures are unremarkable.
IMPRESSION: No active cardiopulmonary disease.

## 2023-08-18 ENCOUNTER — Encounter (HOSPITAL_COMMUNITY): Payer: Self-pay | Admitting: *Deleted

## 2023-08-18 ENCOUNTER — Other Ambulatory Visit: Payer: Self-pay

## 2023-08-18 ENCOUNTER — Emergency Department (HOSPITAL_COMMUNITY)
Admission: EM | Admit: 2023-08-18 | Discharge: 2023-08-18 | Disposition: A | Discharge status: Home / Self Care | Payer: Commercial Managed Care - HMO | Attending: Emergency Medicine | Admitting: Emergency Medicine

## 2023-08-18 DIAGNOSIS — J449 Chronic obstructive pulmonary disease, unspecified: Secondary | ICD-10-CM | POA: Diagnosis not present

## 2023-08-18 DIAGNOSIS — H6122 Impacted cerumen, left ear: Secondary | ICD-10-CM | POA: Diagnosis not present

## 2023-08-18 DIAGNOSIS — Z7951 Long term (current) use of inhaled steroids: Secondary | ICD-10-CM | POA: Insufficient documentation

## 2023-08-18 DIAGNOSIS — H9202 Otalgia, left ear: Secondary | ICD-10-CM | POA: Diagnosis present

## 2023-08-18 MED ORDER — CARBAMIDE PEROXIDE 6.5 % OT SOLN: 5.0000 [drp] | Freq: Two times a day (BID) | OTIC | 0 refills | Status: DC

## 2023-08-18 NOTE — ED Triage Notes (Signed)
The lt ear has been painful and his  ear has also been ringing for 4 days

## 2023-08-18 NOTE — Discharge Instructions (Addendum)
You have been seen today for your complaint of ear fullness. Your discharge medications include Debrox.  Apply a few drops to the left ear while laying on your right side and respiratory at least 5 minutes until the sizzling sound stops.  Then rinse the ear out with a spray bottle of room temperature water.  You may repeat this process multiple times. Follow up with: Audiology.  I have included their phone number for you to call for an appointment if the ringing in your ear does not stop Please seek immediate medical care if you develop any of the following symptoms: Your hearing isn't getting better. Your hearing is getting worse. You have pain or redness in your ear. You feel dizzy. You have nausea or vomiting. At this time there does not appear to be the presence of an emergent medical condition, however there is always the potential for conditions to change. Please read and follow the below instructions.  Do not take your medicine if  develop an itchy rash, swelling in your mouth or lips, or difficulty breathing; call 911 and seek immediate emergency medical attention if this occurs.  You may review your lab tests and imaging results in their entirety on your MyChart account.  Please discuss all results of fully with your primary care provider and other specialist at your follow-up visit.  Note: Portions of this text may have been transcribed using voice recognition software. Every effort was made to ensure accuracy; however, inadvertent computerized transcription errors may still be present.

## 2023-08-18 NOTE — ED Provider Notes (Signed)
[place] EMERGENCY DEPARTMENT AT [facility] Provider Note   CSN: [id number] Arrival date & time: 08/18/23  1603     History  Chief Complaint  Patient presents with   Otalgia    Douglas Guzman is a 42 y.o. male.  With history of COPD presenting for evaluation of left-sided otalgia.  He states that he pain has gotten worse over the past 4 days.  It is described as a fullness.  He reports some muffled hearing and some slight ringing in the left ear as well.  No issues with the right ear.  He denies any trauma to the area.  No recent swimming.  No difficulty chewing or swallowing.  No sore throat.  He reports some mild congestion.  No fevers or chills.  He has not tried anything for his symptoms at home.  States he does struggle with cerumen impactions.   Otalgia      Home Medications Prior to Admission medications   Medication Sig Start Date End Date Taking? Authorizing Provider  carbamide peroxide (DEBROX) 6.5 % OTIC solution Place 5 drops into the left ear 2 (two) times daily. 08/18/23  Yes Fadi Menter, Edsel Petrin, PA-C  albuterol (VENTOLIN HFA) 108 (90 Base) MCG/ACT inhaler INHALE 2 PUFFS INTO THE LUNGS EVERY 6 (SIX) HOURS AS NEEDED FOR WHEEZING OR SHORTNESS OF BREATH. 09/02/20 09/02/21  [name], [name], MD  ALPRAZolam (XANAX) 0.5 MG tablet Take 1 tablet (0.5 mg total) by mouth at bedtime as needed for anxiety. 09/29/20   [name], [name], MD  budesonide-formoterol (SYMBICORT) 160-4.5 MCG/ACT inhaler INHALE 2 PUFFS INTO THE LUNGS 2 (TWO) TIMES DAILY. 09/02/20 09/02/21  [name], [name], MD  busPIRone (BUSPAR) 10 MG tablet Take 1 tablet (10 mg total) by mouth 2 (two) times daily. 09/29/20   [name], [name], MD  cetirizine (ZYRTEC) 10 MG tablet Take 1 tablet (10 mg total) by mouth at bedtime. For nasal congestion 09/02/20   [name], [name], MD  diclofenac sodium (VOLTAREN) 1 % GEL Apply 4 g topically 4 (four) times daily. 12/22/18   Melene Plan, DO  doxycycline (VIBRAMYCIN) 100 MG capsule Take 1  capsule (100 mg total) by mouth 2 (two) times daily. 11/05/21   Avegno, Zachery Dakins, FNP  omeprazole (PRILOSEC) 40 MG capsule ONE PILL IN THE MORNING AND ONE BEFORE BEDTIME TO DECREASE STOMACH ACID Patient taking differently: Take 40 mg by mouth in the morning and at bedtime.  09/02/20 09/02/21  [name], [name], MD  sertraline (ZOLOFT) 100 MG tablet Take 1 tablet (100 mg total) by mouth daily. Before bedtime to decrease worry/anxiety Patient taking differently: Take 100 mg by mouth at bedtime.  09/29/20   Cain Saupe, MD      Allergies    Patient has no known allergies.    Review of Systems   Review of Systems  HENT:  Positive for ear pain.   All other systems reviewed and are negative.   Physical Exam Updated Vital Signs BP (!) 124/100 (BP Location: Right Arm)   Pulse 100   Temp 97.7 F (36.5 C) (Oral)   Resp 18   Ht 5\' 8"  (1.727 m)   Wt 63.5 kg   SpO2 93%   BMI 21.29 kg/m  Physical Exam Vitals and nursing note reviewed.  Constitutional:      General: He is not in acute distress.    Appearance: Normal appearance. He is normal weight. He is not ill-appearing.  HENT:     Head: Normocephalic and atraumatic.  Right Ear: Tympanic membrane and ear canal normal. There is no impacted cerumen.     Left Ear: Ear canal normal. There is impacted cerumen.     Mouth/Throat:     Mouth: Mucous membranes are moist.     Pharynx: Oropharynx is clear. No oropharyngeal exudate or posterior oropharyngeal erythema.  Pulmonary:     Effort: Pulmonary effort is normal. No respiratory distress.  Abdominal:     General: Abdomen is flat.  Musculoskeletal:        General: Normal range of motion.     Cervical back: Normal range of motion and neck supple. No rigidity.  Skin:    General: Skin is warm and dry.  Neurological:     Mental Status: He is alert and oriented to person, place, and time.  Psychiatric:        Mood and Affect: Mood normal.        Behavior: Behavior normal.     ED Results /  Procedures / Treatments   Labs (all labs ordered are listed, but only abnormal results are displayed) Labs Reviewed - No data to display  EKG None  Radiology No results found.  Procedures Procedures    Medications Ordered in ED Medications - No data to display  ED Course/ Medical Decision Making/ A&P                                 Medical Decision Making Risk OTC drugs.   This patient presents to the ED for concern of left-sided otalgia, this involves an extensive number of treatment options, and is a complaint that carries with it a high risk of complications and morbidity.  Differential diagnosis includes otitis media, otitis externa, cerumen impaction, mastoiditis, labyrinthitis, cholesteatoma  Additional history obtained from: Nursing notes from this visit.  Afebrile, hemodynamically stable.  42 year old male presenting for evaluation of left-sided otalgia.  This is been worsening over the past 4 days.  He reports an ear fullness and some tinnitus of the left ear as well.  He denies any signs or symptoms of systemic infection.  On exam, there is impacted cerumen of the left canal.  Unable to appreciate the TM.  Right ear is unremarkable.  He has no trismus.  No evidence of mastoiditis.  Overall suspect cerumen impaction as the cause of his symptoms.  He states he has dealt with similar symptoms in the past.  Will send prescription for Debrox and educated him at bedside on how to appropriately disimpact cerumen from the ear.  He was also given contact information for audiology and encouraged to follow-up if the muffled hearing and tinnitus does not improve.  He was given return precautions including signs and symptoms of otitis media and mastoiditis.  Stable at discharge.  At this time there does not appear to be any evidence of an acute emergency medical condition and the patient appears stable for discharge with appropriate outpatient follow up. Diagnosis was discussed with  patient who verbalizes understanding of care plan and is agreeable to discharge. I have discussed return precautions with patient who verbalizes understanding. Patient encouraged to follow-up with their PCP within 1 week. All questions answered.  Note: Portions of this report may have been transcribed using voice recognition software. Every effort was made to ensure accuracy; however, inadvertent computerized transcription errors may still be present.        Final Clinical Impression(s) / ED Diagnoses Final diagnoses:  Impacted cerumen of left ear    Rx / DC Orders ED Discharge Orders          Ordered    carbamide peroxide (DEBROX) 6.5 % OTIC solution  2 times daily        08/18/23 1719              Michelle Piper, Cordelia Poche 08/18/23 1728    Tegeler, Canary Brim, MD 08/18/23 [phone]

## 2023-08-31 ENCOUNTER — Other Ambulatory Visit: Payer: Self-pay

## 2023-08-31 ENCOUNTER — Encounter (HOSPITAL_COMMUNITY): Payer: Self-pay | Admitting: Emergency Medicine

## 2023-08-31 ENCOUNTER — Ambulatory Visit (HOSPITAL_COMMUNITY)
Admission: EM | Admit: 2023-08-31 | Discharge: 2023-08-31 | Disposition: A | Discharge status: Home / Self Care | Payer: Commercial Managed Care - HMO | Attending: Family Medicine | Admitting: Family Medicine

## 2023-08-31 DIAGNOSIS — H6122 Impacted cerumen, left ear: Secondary | ICD-10-CM | POA: Diagnosis not present

## 2023-08-31 DIAGNOSIS — H9312 Tinnitus, left ear: Secondary | ICD-10-CM | POA: Diagnosis not present

## 2023-08-31 NOTE — ED Triage Notes (Signed)
Pt c/o of ringing on his left ear for a month getting worse today.

## 2023-08-31 NOTE — ED Provider Notes (Signed)
  [facility] CARE CENTER   [id number] 08/31/23 Arrival Time: 1400  ASSESSMENT & PLAN:  1. Impacted cerumen of left ear    Left ear flushed by RN.  Pt reports improvement with hearing after flushing; still with tinnitus. No signs of infection.  Rec f/u with PCP; may need audiology/ENT evaluation.  Reviewed expectations re: course of current medical issues. Questions answered. Outlined signs and symptoms indicating need for more acute intervention. Understanding verbalized. After Visit Summary given.   SUBJECTIVE: History from: Patient. Douglas Guzman is a 42 y.o. male. Complaint of muffled hearing and occas ringing in LEFT ear; noted over past month. Denies ear pain/drainage/bleeding. OTC Debrox gtts without relief.  OBJECTIVE:  Vitals:   08/31/23 1421  BP: (!) 147/84  Pulse: 62  Resp: 18  Temp: 98 F (36.7 C)  TempSrc: Oral  SpO2: 98%    General appearance: alert; no distress Eyes: PERRLA; EOMI; conjunctiva normal HENT: [place]; AT; without nasal congestion; L EAC cerumen impaction; TM appears normal after ear flushing Neck: supple  Skin: warm and dry Neurologic: normal gait Psychological: alert and cooperative; normal mood and affect   No Known Allergies  Past Medical History:  Diagnosis Date   COPD (chronic obstructive pulmonary disease) (HCC)    Tobacco dependence    Social History   Socioeconomic History   Marital status: Single    Spouse name: Not on file   Number of children: Not on file   Years of education: Not on file   Highest education level: Not on file  Occupational History   Not on file  Tobacco Use   Smoking status: Every Day    Current packs/day: 2.00    Average packs/day: 2.0 packs/day for 26.0 years (52.0 ttl pk-yrs)    Types: Cigarettes   Smokeless tobacco: Never  Vaping Use   Vaping status: Former  Substance and Sexual Activity   Alcohol use: Yes    Comment: occ   Drug use: Yes    Types: Marijuana   Sexual activity: Yes  Other  Topics Concern   Not on file  Social History Narrative   Not on file   Social Determinants of Health   Financial Resource Strain: Not on file  Food Insecurity: Not on file  Transportation Needs: Not on file  Physical Activity: Not on file  Stress: Not on file  Social Connections: Not on file  Intimate Partner Violence: Not on file   Family History  Problem Relation Age of Onset   Diabetes Mother    COPD Mother    CAD Father    Throat cancer Father    Past Surgical History:  Procedure Laterality Date   BRAIN SURGERY     INGUINAL HERNIA REPAIR     occurred in childhood     Mardella Layman, MD 09/02/23 1247

## 2023-09-09 ENCOUNTER — Ambulatory Visit: Payer: Commercial Managed Care - HMO | Attending: Audiologist | Admitting: Audiologist

## 2023-12-28 ENCOUNTER — Ambulatory Visit (HOSPITAL_COMMUNITY)
Admission: EM | Admit: 2023-12-28 | Discharge: 2023-12-28 | Disposition: A | Discharge status: Home / Self Care | Payer: Commercial Managed Care - HMO

## 2023-12-28 ENCOUNTER — Encounter (HOSPITAL_COMMUNITY): Payer: Self-pay

## 2023-12-28 DIAGNOSIS — L819 Disorder of pigmentation, unspecified: Secondary | ICD-10-CM | POA: Diagnosis not present

## 2023-12-28 NOTE — Discharge Instructions (Signed)
As discussed I will follow-up with your primary care provider if the redness persists or worsens.  Return here as needed.

## 2023-12-28 NOTE — ED Provider Notes (Signed)
[facility]    CSN: [id number] Arrival date & time: 12/28/23  1127      History   Chief Complaint Chief Complaint  Patient presents with   Rash    HPI Douglas Guzman is a 43 y.o. male.   Patient presents with "rash" noted to bilateral inner legs x 2 weeks.  Denies itching and pain.   Rash   Past Medical History:  Diagnosis Date   COPD (chronic obstructive pulmonary disease) (HCC)    Tobacco dependence     Patient Active Problem List   Diagnosis Date Noted   DOE (dyspnea on exertion) 09/02/2018   COPD GOLD II/ still smoking  09/02/2018   Cigarette smoker 09/02/2018    Past Surgical History:  Procedure Laterality Date   BRAIN SURGERY     INGUINAL HERNIA REPAIR     occurred in childhood       Home Medications    Prior to Admission medications   Medication Sig Start Date End Date Taking? Authorizing Provider  albuterol (VENTOLIN HFA) 108 (90 Base) MCG/ACT inhaler INHALE 2 PUFFS INTO THE LUNGS EVERY 6 (SIX) HOURS AS NEEDED FOR WHEEZING OR SHORTNESS OF BREATH. 09/02/20 12/28/23 Yes [name], [name], MD  budesonide-formoterol (SYMBICORT) 160-4.5 MCG/ACT inhaler INHALE 2 PUFFS INTO THE LUNGS 2 (TWO) TIMES DAILY. 09/02/20 12/28/23 Yes [name], [name], MD  busPIRone (BUSPAR) 10 MG tablet Take 1 tablet (10 mg total) by mouth 2 (two) times daily. 09/29/20  Yes [name], [name], MD  sertraline (ZOLOFT) 100 MG tablet Take 1 tablet (100 mg total) by mouth daily. Before bedtime to decrease worry/anxiety Patient taking differently: Take 100 mg by mouth at bedtime. 09/29/20   Cain Saupe, MD    Family History Family History  Problem Relation Age of Onset   Diabetes Mother    COPD Mother    CAD Father    Throat cancer Father     Social History Social History   Tobacco Use   Smoking status: Every Day    Current packs/day: 2.00    Average packs/day: 2.0 packs/day for 26.0 years (52.0 ttl pk-yrs)    Types: Cigarettes   Smokeless tobacco: Never  Vaping Use   Vaping  status: Former  Substance Use Topics   Alcohol use: Yes    Comment: occ   Drug use: Yes    Types: Marijuana     Allergies   Patient has no known allergies.   Review of Systems Review of Systems  Skin:  Positive for color change. Negative for rash.     Physical Exam Triage Vital Signs ED Triage Vitals  Encounter Vitals Group     BP 12/28/23 1307 128/75     Systolic BP Percentile --      Diastolic BP Percentile --      Pulse Rate 12/28/23 1307 60     Resp 12/28/23 1307 16     Temp 12/28/23 1307 97.9 F (36.6 C)     Temp Source 12/28/23 1307 Oral     SpO2 12/28/23 1307 95 %     Weight 12/28/23 1307 157 lb (71.2 kg)     Height 12/28/23 1307 5\' 8"  (1.727 m)     Head Circumference --      Peak Flow --      Pain Score 12/28/23 1305 0     Pain Loc --      Pain Education --      Exclude from Growth Chart --    No data  found.  Updated Vital Signs BP 128/75 (BP Location: Right Arm)   Pulse 60   Temp 97.9 F (36.6 C) (Oral)   Resp 16   Ht 5\' 8"  (1.727 m)   Wt 157 lb (71.2 kg)   SpO2 95%   BMI 23.87 kg/m   Visual Acuity Right Eye Distance:   Left Eye Distance:   Bilateral Distance:    Right Eye Near:   Left Eye Near:    Bilateral Near:     Physical Exam Vitals and nursing note reviewed.  Constitutional:      General: He is awake. He is not in acute distress.    Appearance: Normal appearance. He is well-developed. He is not ill-appearing.  Skin:    Findings: Erythema present.     Comments: Erythema noted to proximal aspect of the lower legs and inner thighs that is consistent with the appearance of mottling.  Neurological:     Mental Status: He is alert.  Psychiatric:        Behavior: Behavior is cooperative.      UC Treatments / Results  Labs (all labs ordered are listed, but only abnormal results are displayed) Labs Reviewed - No data to display  EKG   Radiology No results found.  Procedures Procedures (including critical care  time)  Medications Ordered in UC Medications - No data to display  Initial Impression / Assessment and Plan / UC Course  I have reviewed the triage vital signs and the nursing notes.  Pertinent labs & imaging results that were available during my care of the patient were reviewed by me and considered in my medical decision making (see chart for details).     Patient presents with "rash" to bilateral inner legs x 2 weeks.  Denies itching and pain.  Erythema noted to proximal aspect of lower legs and inner thighs that is consistent with the appearance of mottling.  Patient is currently wearing shorts.  Recommended patient wear warmer clothing when he is outside and see if this helps.  Discussed follow-up and return precautions. Final Clinical Impressions(s) / UC Diagnoses   Final diagnoses:  Discoloration of skin     Discharge Instructions      As discussed I will follow-up with your primary care provider if the redness persists or worsens.  Return here as needed.    ED Prescriptions   None    PDMP not reviewed this encounter.   Wynonia Lawman A, NP 12/28/23 1504

## 2023-12-28 NOTE — ED Triage Notes (Signed)
Patient has a "rash" spreading up the inner legs bilaterally and into the thighs. Lines are red and appear to be following along the veins. Onset 2 weeks ago according to the Patient. No dietary or medication changes.   Patient has not taken any meds or used anything topically for it.
